# Patient Record
Sex: Female | Born: 1975 | Race: Asian | Hispanic: No | Marital: Married | Smoking: Never smoker
Health system: Southern US, Community
[De-identification: ages and names within clinical notes are randomized; demographics above are authoritative.]

## PROBLEM LIST (undated history)

## (undated) DIAGNOSIS — R011 Cardiac murmur, unspecified: Secondary | ICD-10-CM

## (undated) DIAGNOSIS — N390 Urinary tract infection, site not specified: Secondary | ICD-10-CM

## (undated) HISTORY — DX: Cardiac murmur, unspecified: R01.1

## (undated) HISTORY — DX: Urinary tract infection, site not specified: N39.0

## (undated) HISTORY — PX: UTERINE FIBROID SURGERY: SHX826

---

## 2017-06-02 DIAGNOSIS — R300 Dysuria: Secondary | ICD-10-CM | POA: Diagnosis not present

## 2017-06-02 DIAGNOSIS — N3001 Acute cystitis with hematuria: Secondary | ICD-10-CM | POA: Diagnosis not present

## 2017-07-02 ENCOUNTER — Ambulatory Visit (INDEPENDENT_AMBULATORY_CARE_PROVIDER_SITE_OTHER): Payer: BLUE CROSS/BLUE SHIELD | Admitting: Family

## 2017-07-02 ENCOUNTER — Encounter: Payer: Self-pay | Admitting: Family

## 2017-07-02 VITALS — BP 126/80 | HR 86 | Temp 98.4°F | Wt 136.8 lb

## 2017-07-02 DIAGNOSIS — Z87898 Personal history of other specified conditions: Secondary | ICD-10-CM | POA: Diagnosis not present

## 2017-07-02 DIAGNOSIS — Z0000 Encounter for general adult medical examination without abnormal findings: Secondary | ICD-10-CM | POA: Insufficient documentation

## 2017-07-02 DIAGNOSIS — Z7689 Persons encountering health services in other specified circumstances: Secondary | ICD-10-CM

## 2017-07-02 DIAGNOSIS — Z319 Encounter for procreative management, unspecified: Secondary | ICD-10-CM | POA: Diagnosis not present

## 2017-07-02 NOTE — Assessment & Plan Note (Signed)
Patient and husband would to call/research clinics on their own. Will let me know if needs a referral.

## 2017-07-02 NOTE — Assessment & Plan Note (Signed)
Resolved. 12 years ago. Declines cardiology referral

## 2017-07-02 NOTE — Patient Instructions (Addendum)
Needs tetanus vaccine.   May get Tdap at local pharmacy  Bring  Labs from [facility] at some point.   Pap testing every 3 years AS LONG AS NORMAL.  Let me know if you need referral for fertility clinic- happy to place referral if needed  Pleasure meeting you

## 2017-07-02 NOTE — Progress Notes (Signed)
Pre visit review using our clinic review tool, if applicable. No additional management support is needed unless otherwise documented below in the visit note. 

## 2017-07-02 NOTE — Assessment & Plan Note (Signed)
Will bring [facility] from [facility] and return for CPE later this year.

## 2017-07-02 NOTE — Progress Notes (Signed)
Subjective:    Patient ID: Jordan Schmidt, female    DOB: [birth year], 41 y.o.   MRN: [id number]  CC: [name] is a 41 y.o. female who presents today to establish care.    HPI: Feeling well today. Accompanied by husband.   Infertility- had been pursuing IVT for 2+ years in [place], would like to start again.   H/o Palpitations 12 years ago- resolved. Saw heart doctor in [place]. Per patient, testing was normal. noticed with stress had irregular heart beat. No chest pain, sob. No h/o MI.   Of note, language barrier and husband does most of speaking.   Pap smear normal per patient in [place].      HISTORY:  Past Medical History:  Diagnosis Date  . Heart murmur   . UTI (urinary tract infection)    Past Surgical History:  Procedure Laterality Date  . UTERINE FIBROID SURGERY     Family History  Problem Relation Age of Onset  . Stroke Maternal Grandmother   . Heart disease Maternal Grandfather     Allergies: Patient has no allergy information on record. No current outpatient prescriptions on file prior to visit.   No current facility-administered medications on file prior to visit.     Social History  Substance Use Topics  . Smoking status: Never Smoker  . Smokeless tobacco: Never Used  . Alcohol use No    Review of Systems  Constitutional: Negative for chills and fever.  Respiratory: Negative for cough.   Cardiovascular: Negative for chest pain and palpitations.  Gastrointestinal: Negative for nausea and vomiting.      Objective:    BP 126/80   Pulse 86   Temp 98.4 F (36.9 C) (Oral)   Wt 136 lb 12.8 oz (62.1 kg)   LMP 06/19/2017 (Exact Date)   SpO2 97%  BP Readings from Last 3 Encounters:  07/02/17 126/80   Wt Readings from Last 3 Encounters:  07/02/17 136 lb 12.8 oz (62.1 kg)    Physical Exam  Constitutional: She appears well-developed and well-nourished.  Eyes: Conjunctivae are normal.  Cardiovascular: Normal rate, regular rhythm, normal heart  sounds and normal pulses.   Pulmonary/Chest: Effort normal and breath sounds normal. She has no wheezes. She has no rhonchi. She has no rales.  Neurological: She is alert.  Skin: Skin is warm and dry.  Psychiatric: She has a normal mood and affect. Her speech is normal and behavior is normal. Thought content normal.  Vitals reviewed.      Assessment & Plan:   Problem List Items Addressed This Visit      Other   Infertility management - Primary    Patient and husband would to call/research clinics on their own. Will let me know if needs a referral.       Encounter to establish care    Will [facility] and return for CPE later this year.      History of palpitations    Resolved. 12 years ago. Declines cardiology referral          Ms. Badders does not currently have medications on file.   No orders of the defined types were placed in this encounter.   Return precautions given.   Risks, benefits, and alternatives of the medications and treatment plan prescribed today were discussed, and patient expressed understanding.   Education regarding symptom management and diagnosis given to patient on AVS.  Continue to follow with No primary care provider on file. for  routine health maintenance.   Cambry Scheffel and I agreed with plan.   Rennie PlowmanMargaret Soham Hollett, FNP

## 2017-07-21 ENCOUNTER — Telehealth: Payer: Self-pay | Admitting: Family

## 2017-07-21 NOTE — Telephone Encounter (Signed)
Pt dropped off lab results that Arnett requested. Papers are up front in color folder.

## 2017-07-21 NOTE — Telephone Encounter (Signed)
The lab results have been placed in provider folder.

## 2017-07-22 ENCOUNTER — Telehealth: Payer: Self-pay | Admitting: Family

## 2017-07-22 NOTE — Telephone Encounter (Signed)
Call pt  rec'd labs  Kidney and renal function are normal. Thyroid normal. No anemia. Normal liver enzymes. Cholesterol is low. You're low cardiovascular risk.  Vitamin D is slightly low. Please start cholecalciferol 800 units daily for next 3 to 4 months. You may find this over the counter in the drug store. Please call to make an appointment for 3 to 4 months out so we can recheck. Also, please ensure you are following a diet high in calcium -- research shows better outcomes with dietary sources including kale, yogurt, broccolii, cheese, okra, almonds- to name a few.    Follow up as needed

## 2017-07-22 NOTE — Telephone Encounter (Signed)
Patient stated that she will have husband call for results due to her not speaking english that well.

## 2017-07-22 NOTE — Telephone Encounter (Signed)
Patients husband was informed of results.  Patients husband understood and no questions, comments, or concerns at this time.  

## 2017-11-18 ENCOUNTER — Encounter: Payer: BLUE CROSS/BLUE SHIELD | Admitting: Family

## 2018-04-19 ENCOUNTER — Other Ambulatory Visit (HOSPITAL_COMMUNITY)
Admission: RE | Admit: 2018-04-19 | Discharge: 2018-04-19 | Disposition: A | Discharge status: Home / Self Care | Payer: BLUE CROSS/BLUE SHIELD | Source: Ambulatory Visit | Attending: Family | Admitting: Family

## 2018-04-19 ENCOUNTER — Encounter: Payer: Self-pay | Admitting: Family

## 2018-04-19 ENCOUNTER — Ambulatory Visit (INDEPENDENT_AMBULATORY_CARE_PROVIDER_SITE_OTHER): Payer: BLUE CROSS/BLUE SHIELD | Admitting: Family

## 2018-04-19 VITALS — BP 134/90 | HR 105 | Temp 98.5°F | Resp 16 | Ht 64.0 in | Wt 141.5 lb

## 2018-04-19 DIAGNOSIS — Z0000 Encounter for general adult medical examination without abnormal findings: Secondary | ICD-10-CM

## 2018-04-19 DIAGNOSIS — Z1151 Encounter for screening for human papillomavirus (HPV): Secondary | ICD-10-CM | POA: Diagnosis not present

## 2018-04-19 NOTE — Progress Notes (Signed)
Subjective:    Patient ID: Jordan Schmidt, female    DOB: 01-22-1976, 42 y.o.   MRN: [id number]  CC: Jordan Schmidt is a 42 y.o. female who presents today for physical exam.    HPI: No complaints today Feeling well.  Thinks they may try a fertility clinic later this year, however unsure.    Colorectal Cancer Screening: No early family history.  Breast Cancer Screening: Mammogram due  Cervical Cancer Screening: due. No pelvic pain.  Bone Health screening/DEXA for 65+: No increased fracture risk. Defer screening at this time. Lung Cancer Screening: Doesn't have 30 year pack year history and age > 55 years.       Tetanus - unsure  Labs: Screening labs done with husbands employer.  Exercise: Gets regular exercise.  Alcohol use: none Smoking/tobacco use: Nonsmoker.  Regular dental exams: UTD Wears seat belt: Yes. Skin: no h/o skin cancer or new lesions on skin  HISTORY:  Past Medical History:  Diagnosis Date  . Heart murmur   . UTI (urinary tract infection)     Past Surgical History:  Procedure Laterality Date  . UTERINE FIBROID SURGERY     Family History  Problem Relation Age of Onset  . Stroke Maternal Grandmother   . Heart disease Maternal Grandfather   . Colon cancer Neg Hx   . Breast cancer Neg Hx       ALLERGIES: Patient has no known allergies.  Current Outpatient Medications on File Prior to Visit  Medication Sig Dispense Refill  . Multiple Vitamin (MULTIVITAMIN WITH MINERALS) TABS tablet Take 1 tablet by mouth daily.     No current facility-administered medications on file prior to visit.     Social History   Tobacco Use  . Smoking status: Never Smoker  . Smokeless tobacco: Never Used  Substance Use Topics  . Alcohol use: No  . Drug use: No    Review of Systems  Constitutional: Negative for chills, fever and unexpected weight change.  HENT: Negative for congestion.   Respiratory: Negative for cough.   Cardiovascular: Negative for chest pain,  palpitations and leg swelling.  Gastrointestinal: Negative for nausea and vomiting.  Genitourinary: Negative for pelvic pain.  Musculoskeletal: Negative for arthralgias and myalgias.  Skin: Negative for rash.  Neurological: Negative for headaches.  Hematological: Negative for adenopathy.  Psychiatric/Behavioral: Negative for confusion.      Objective:    BP 134/90 (BP Location: Left Arm, Patient Position: Sitting, Cuff Size: Normal)   Pulse (!) 105   Temp 98.5 F (36.9 C) (Oral)   Resp 16   Ht  (1.626 m)   Wt 141 lb 8 oz (64.2 kg)   LMP 04/01/2018   SpO2 97%   BMI 24.29 kg/m   BP Readings from Last 3 Encounters:  04/19/18 134/90  07/02/17 126/80   Wt Readings from Last 3 Encounters:  04/19/18 141 lb 8 oz (64.2 kg)  07/02/17 136 lb 12.8 oz (62.1 kg)    Physical Exam  Constitutional: She appears well-developed and well-nourished.  Eyes: Conjunctivae are normal.  Neck: No thyroid mass and no thyromegaly present.  Cardiovascular: Normal rate, regular rhythm, normal heart sounds and normal pulses.  Pulmonary/Chest: Effort normal and breath sounds normal. She has no wheezes. She has no rhonchi. She has no rales. Right breast exhibits no inverted nipple, no mass, no nipple discharge, no skin change and no tenderness. Left breast exhibits no inverted nipple, no mass, no nipple discharge, no skin change and no tenderness.  Breasts are symmetrical.  No masses or asymmetry appreciated during CBE.  Genitourinary: Uterus is not enlarged, not fixed and not tender. Cervix exhibits no motion tenderness, no discharge and no friability. Right adnexum displays no mass, no tenderness and no fullness. Left adnexum displays no mass, no tenderness and no fullness.  Genitourinary Comments: Pap performed. No CMT. Unable to appreciated ovaries.  Lymphadenopathy:       Head (right side): No submental, no submandibular, no tonsillar, no preauricular, no posterior auricular and no occipital  adenopathy present.       Head (left side): No submental, no submandibular, no tonsillar, no preauricular, no posterior auricular and no occipital adenopathy present.       Right cervical: No superficial cervical, no deep cervical and no posterior cervical adenopathy present.      Left cervical: No superficial cervical, no deep cervical and no posterior cervical adenopathy present.    She has no axillary adenopathy.       Right axillary: No pectoral and no lateral adenopathy present.       Left axillary: No pectoral and no lateral adenopathy present. Neurological: She is alert.  Skin: Skin is warm and dry.  Psychiatric: She has a normal mood and affect. Her speech is normal and behavior is normal. Thought content normal.  Vitals reviewed.      Assessment & Plan:   Problem List Items Addressed This Visit      Other   Routine physical examination - Primary    CBE and pap performed today. Patient will let me know if tetanus is due based on records at home. Mammogram ( baseline) ordered and scheduled for patient.       Relevant Orders   Cytology - PAP   MM 3D SCREEN BREAST BILATERAL       I am having Jordan Schmidt maintain her multivitamin with minerals.   No orders of the defined types were placed in this encounter.   Return precautions given.   Risks, benefits, and alternatives of the medications and treatment plan prescribed today were discussed, and patient expressed understanding.   Education regarding symptom management and diagnosis given to patient on AVS.   Continue to follow with Jordan Grana, FNP for routine health maintenance.   Jordan Schmidt and I agreed with plan.   Jordan Plowman, FNP

## 2018-04-19 NOTE — Patient Instructions (Addendum)
Let us know when your tetanus vaccine was.  Labs at [facility] as you did last year. You may send them to our office - FAX [place], [place] Adopting a healthy lifestyle and getting preventive care can go a long way to promote health and wellness. Talk with your health care provider about what schedule of regular examinations is right for you. This is a good chance for you to check in with your provider about disease prevention and staying healthy. In between checkups, there are plenty of things you can do on your own. Experts have done a lot of research about which lifestyle changes and preventive measures are most likely to keep you healthy. Ask your health care provider for more information. Weight and diet Eat a healthy diet  Be sure to include plenty of vegetables, fruits, low-fat dairy products, and lean protein.  Do not eat a lot of foods high in solid fats, added sugars, or salt.  Get regular exercise. This is one of the most important things you can do for your health. ? Most adults should exercise for at least 150 minutes each week. The exercise should increase your heart rate and make you sweat (moderate-intensity exercise). ? Most adults should also do strengthening exercises at least twice a week. This is in addition to the moderate-intensity exercise.  Maintain a healthy weight  Body mass index (BMI) is a measurement that can be used to identify possible weight problems. It estimates body fat based on height and weight. Your health care provider can help determine your BMI and help you achieve or maintain a healthy weight.  For females 80 years of age and older: ? A BMI below 18.5 is considered underweight. ? A BMI of 18.5 to 24.9 is normal. ? A BMI of 25 to 29.9 is considered overweight. ? A BMI of 30 and above is considered obese.  Watch levels of cholesterol and blood lipids  You should start having your blood tested for lipids and cholesterol at 40  years of age, then have this test every 5 years.  You may need to have your cholesterol levels checked more often if: ? Your lipid or cholesterol levels are high. ? You are older than 40 years of age. ? You are at high risk for heart disease.  Cancer screening Lung Cancer  Lung cancer screening is recommended for adults 44-42 years old who are at high risk for lung cancer because of a history of smoking.  A yearly low-dose CT scan of the lungs is recommended for people who: ? Currently smoke. ? Have quit within the past 15 years. ? Have at least a 30-pack-year history of smoking. A pack year is smoking an average of one pack of cigarettes a day for 1 year.  Yearly screening should continue until it has been 15 years since you quit.  Yearly screening should stop if you develop a health problem that would prevent you from having lung cancer treatment.  Breast Cancer  Practice breast self-awareness. This means understanding how your breasts normally appear and feel.  It also means doing regular breast self-exams. Let your health care provider know about any changes, no matter how small.  If you are in your 20s or 30s, you should have a clinical breast exam (CBE) by a health care provider every 1-3 years as part of a regular health exam.  If you are 80 or older, have a CBE every year. Also consider having a breast  X-ray (mammogram) every year.  If you have a family history of breast cancer, talk to your health care provider about genetic screening.  If you are at high risk for breast cancer, talk to your health care provider about having an MRI and a mammogram every year.  Breast cancer gene (BRCA) assessment is recommended for women who have family members with BRCA-related cancers. BRCA-related cancers include: ? Breast. ? Ovarian. ? Tubal. ? Peritoneal cancers.  Results of the assessment will determine the need for genetic counseling and BRCA1 and BRCA2 testing.  Cervical  Cancer Your health care provider may recommend that you be screened regularly for cancer of the pelvic organs (ovaries, uterus, and vagina). This screening involves a pelvic examination, including checking for microscopic changes to the surface of your cervix (Pap test). You may be encouraged to have this screening done every 3 years, beginning at age 78.  For women ages 57-65, health care providers may recommend pelvic exams and Pap testing every 3 years, or they may recommend the Pap and pelvic exam, combined with testing for human papilloma virus (HPV), every 5 years. Some types of HPV increase your risk of cervical cancer. Testing for HPV may also be done on women of any age with unclear Pap test results.  Other health care providers may not recommend any screening for nonpregnant women who are considered low risk for pelvic cancer and who do not have symptoms. Ask your health care provider if a screening pelvic exam is right for you.  If you have had past treatment for cervical cancer or a condition that could lead to cancer, you need Pap tests and screening for cancer for at least 20 years after your treatment. If Pap tests have been discontinued, your risk factors (such as having a new sexual partner) need to be reassessed to determine if screening should resume. Some women have medical problems that increase the chance of getting cervical cancer. In these cases, your health care provider may recommend more frequent screening and Pap tests.  Colorectal Cancer  This type of cancer can be detected and often prevented.  Routine colorectal cancer screening usually begins at 40 years of age and continues through 40 years of age.  Your health care provider may recommend screening at an earlier age if you have risk factors for colon cancer.  Your health care provider may also recommend using home test kits to check for hidden blood in the stool.  A small camera at the end of a tube can be used to  examine your colon directly (sigmoidoscopy or colonoscopy). This is done to check for the earliest forms of colorectal cancer.  Routine screening usually begins at age 36.  Direct examination of the colon should be repeated every 5-10 years through 40 years of age. However, you may need to be screened more often if early forms of precancerous polyps or small growths are found.  Skin Cancer  Check your skin from head to toe regularly.  Tell your health care provider about any new moles or changes in moles, especially if there is a change in a mole's shape or color.  Also tell your health care provider if you have a mole that is larger than the size of a pencil eraser.  Always use sunscreen. Apply sunscreen liberally and repeatedly throughout the day.  Protect yourself by wearing long sleeves, pants, a wide-brimmed hat, and sunglasses whenever you are outside.  Heart disease, diabetes, and high blood pressure  High blood  pressure causes heart disease and increases the risk of stroke. High blood pressure is more likely to develop in: ? People who have blood pressure in the high end of the normal range (130-139/85-89 mm Hg). ? People who are overweight or obese. ? People who are African American.  If you are 73-13 years of age, have your blood pressure checked every 3-5 years. If you are 25 years of age or older, have your blood pressure checked every year. You should have your blood pressure measured twice-once when you are at a hospital or clinic, and once when you are not at a hospital or clinic. Record the average of the two measurements. To check your blood pressure when you are not at a hospital or clinic, you can use: ? An automated blood pressure machine at a pharmacy. ? A home blood pressure monitor.  If you are between 32 years and 64 years old, ask your health care provider if you should take aspirin to prevent strokes.  Have regular diabetes screenings. This involves taking a  blood sample to check your fasting blood sugar level. ? If you are at a normal weight and have a low risk for diabetes, have this test once every three years after 40 years of age. ? If you are overweight and have a high risk for diabetes, consider being tested at a younger age or more often. Preventing infection Hepatitis B  If you have a higher risk for hepatitis B, you should be screened for this virus. You are considered at high risk for hepatitis B if: ? You were born in a country where hepatitis B is common. Ask your health care provider which countries are considered high risk. ? Your parents were born in a high-risk country, and you have not been immunized against hepatitis B (hepatitis B vaccine). ? You have HIV or AIDS. ? You use needles to inject street drugs. ? You live with someone who has hepatitis B. ? You have had sex with someone who has hepatitis B. ? You get hemodialysis treatment. ? You take certain medicines for conditions, including cancer, organ transplantation, and autoimmune conditions.  Hepatitis C  Blood testing is recommended for: ? Everyone born from [birth year] through 1963. ? Anyone with known risk factors for hepatitis C.  Sexually transmitted infections (STIs)  You should be screened for sexually transmitted infections (STIs) including gonorrhea and chlamydia if: ? You are sexually active and are younger than 40 years of age. ? You are older than 40 years of age and your health care provider tells you that you are at risk for this type of infection. ? Your sexual activity has changed since you were last screened and you are at an increased risk for chlamydia or gonorrhea. Ask your health care provider if you are at risk.  If you do not have HIV, but are at risk, it may be recommended that you take a prescription medicine daily to prevent HIV infection. This is called pre-exposure prophylaxis (PrEP). You are considered at risk if: ? You are sexually active and  do not regularly use condoms or know the HIV status of your partner(s). ? You take drugs by injection. ? You are sexually active with a partner who has HIV.  Talk with your health care provider about whether you are at high risk of being infected with HIV. If you choose to begin PrEP, you should first be tested for HIV. You should then be tested every 3 months for as  long as you are taking PrEP. Pregnancy  If you are premenopausal and you may become pregnant, ask your health care provider about preconception counseling.  If you may become pregnant, take 400 to 800 micrograms (mcg) of folic acid every day.  If you want to prevent pregnancy, talk to your health care provider about birth control (contraception). Osteoporosis and menopause  Osteoporosis is a disease in which the bones lose minerals and strength with aging. This can result in serious bone fractures. Your risk for osteoporosis can be identified using a bone density scan.  If you are 59 years of age or older, or if you are at risk for osteoporosis and fractures, ask your health care provider if you should be screened.  Ask your health care provider whether you should take a calcium or vitamin D supplement to lower your risk for osteoporosis.  Menopause may have certain physical symptoms and risks.  Hormone replacement therapy may reduce some of these symptoms and risks. Talk to your health care provider about whether hormone replacement therapy is right for you. Follow these instructions at home:  Schedule regular health, dental, and eye exams.  Stay current with your immunizations.  Do not use any tobacco products including cigarettes, chewing tobacco, or electronic cigarettes.  If you are pregnant, do not drink alcohol.  If you are breastfeeding, limit how much and how often you drink alcohol.  Limit alcohol intake to no more than 1 drink per day for nonpregnant women. One drink equals 12 ounces of beer, 5 ounces of  wine, or 1 ounces of hard liquor.  Do not use street drugs.  Do not share needles.  Ask your health care provider for help if you need support or information about quitting drugs.  Tell your health care provider if you often feel depressed.  Tell your health care provider if you have ever been abused or do not feel safe at home. This information is not intended to replace advice given to you by your health care provider. Make sure you discuss any questions you have with your health care provider. Document Released: 06/16/2011 Document Revised: 05/08/2016 Document Reviewed: 09/04/2015 Elsevier Interactive Patient Education  [employer].

## 2018-04-19 NOTE — Assessment & Plan Note (Signed)
CBE and pap performed today. Patient will let me know if tetanus is due based on records at home. Mammogram ( baseline) ordered and scheduled for patient.

## 2018-04-20 LAB — CYTOLOGY - PAP
DIAGNOSIS: NEGATIVE
HPV (WINDOPATH): NOT DETECTED

## 2018-04-20 NOTE — Progress Notes (Signed)
Left message to call.

## 2018-04-28 NOTE — Progress Notes (Signed)
Left message to call.

## 2018-05-03 ENCOUNTER — Telehealth: Payer: Self-pay

## 2018-05-03 NOTE — Telephone Encounter (Signed)
Copied from CRM [id number]. Topic: Quick Communication - Office Called Patient >> Apr 28, 2018  4:14 PM Alisia Ferrari, CMA wrote: Reason for CRM: please transfer to office thanks  >> May 03, 2018  4:57 PM Mickel Baas B, NT wrote: Patient's husband returning call. Please advise. CB#: [phone]

## 2018-05-13 NOTE — Progress Notes (Signed)
See telephone message

## 2018-05-13 NOTE — Telephone Encounter (Signed)
Tried reaching but unable to with number given in message. Spoke with husband and he will check wife's heart rate for a few days and call us back with numbers.   Also he is questioning on why mammogram was ordered due to patient is 89 1/2.  Please advise. Thanks

## 2018-05-13 NOTE — Telephone Encounter (Signed)
Call husband ( please ensure we can speak with him)   Wife's HR was 105 in the office , I suspected from nerves . This is mildly elevated. I wanted her to monitor at home and to ensure she didn't appreciate palpitations. She was having labs with [facility] and I would be interested in those as anemia, or thyroid conditions can elevated.   We had trouble understanding each other and I wanted to ensure that she monitored her HR  As for mammogram, some women prefer a baseline mammogram in their late 30's which I had offered her. Typically covered by insurance companies for baseline however I would advise him to call their insurance to be sure. She may either get it done now OR she may wait until December when she is 14.

## 2018-05-17 ENCOUNTER — Ambulatory Visit
Admission: RE | Admit: 2018-05-17 | Discharge: 2018-05-17 | Disposition: A | Discharge status: Home / Self Care | Payer: BLUE CROSS/BLUE SHIELD | Source: Ambulatory Visit | Attending: Family | Admitting: Family

## 2018-05-17 ENCOUNTER — Encounter: Payer: Self-pay | Admitting: Family

## 2018-05-17 DIAGNOSIS — Z1231 Encounter for screening mammogram for malignant neoplasm of breast: Secondary | ICD-10-CM | POA: Diagnosis not present

## 2018-05-17 DIAGNOSIS — Z0000 Encounter for general adult medical examination without abnormal findings: Secondary | ICD-10-CM | POA: Insufficient documentation

## 2018-05-17 NOTE — Telephone Encounter (Signed)
Spoke with patients husband and he states patients heart rate has been running in the 80s when checked at home. Also will send mychart message on specific heartrates numbers.   She had her mammogram today.  He states she will get labs done.

## 2018-05-18 ENCOUNTER — Telehealth: Payer: Self-pay

## 2018-05-18 DIAGNOSIS — Z0000 Encounter for general adult medical examination without abnormal findings: Secondary | ICD-10-CM

## 2018-05-18 NOTE — Telephone Encounter (Signed)
Copied from CRM [id number]. Topic: General - Other >> May 18, 2018  9:31 AM Percival SpanishKennedy, Cheryl W wrote:  Please check with [employer] cause pt said she was told to go to [facility] and have labs drawn and she went and they did not have a order

## 2018-05-21 NOTE — Telephone Encounter (Signed)
Please print labs that I just ordered I will sign and pateint can take to [facility]

## 2018-05-21 NOTE — Addendum Note (Signed)
Addended by: Allegra GranaARNETT, MARGARET G on: 05/21/2018 12:46 PM   Modules accepted: Orders

## 2018-05-24 NOTE — Telephone Encounter (Signed)
Orders placed in your folder for signature. 

## 2018-05-26 NOTE — Telephone Encounter (Signed)
Left voicemail for Jordan Schmidt on DPR advising lab orders placed up front for pick up

## 2018-05-31 DIAGNOSIS — Z0000 Encounter for general adult medical examination without abnormal findings: Secondary | ICD-10-CM | POA: Diagnosis not present

## 2018-06-01 LAB — CBC WITH DIFFERENTIAL/PLATELET
Basophils Absolute: 0 10*3/uL (ref 0.0–0.2)
Basos: 1 %
EOS (ABSOLUTE): 0.2 10*3/uL (ref 0.0–0.4)
Eos: 3 %
HEMATOCRIT: 43.3 % (ref 34.0–46.6)
HEMOGLOBIN: 14.7 g/dL (ref 11.1–15.9)
IMMATURE GRANULOCYTES: 0 %
Immature Grans (Abs): 0 10*3/uL (ref 0.0–0.1)
LYMPHS: 42 %
Lymphocytes Absolute: 2.6 10*3/uL (ref 0.7–3.1)
MCH: 31.3 pg (ref 26.6–33.0)
MCHC: 33.9 g/dL (ref 31.5–35.7)
MCV: 92 fL (ref 79–97)
MONOCYTES: 4 %
Monocytes Absolute: 0.3 10*3/uL (ref 0.1–0.9)
NEUTROS PCT: 50 %
Neutrophils Absolute: 3.1 10*3/uL (ref 1.4–7.0)
Platelets: 311 10*3/uL (ref 150–450)
RBC: 4.7 x10E6/uL (ref 3.77–5.28)
RDW: 12.4 % (ref 12.3–15.4)
WBC: 6.2 10*3/uL (ref 3.4–10.8)

## 2018-06-01 LAB — COMPREHENSIVE METABOLIC PANEL
ALBUMIN: 4.7 g/dL (ref 3.5–5.5)
ALT: 15 IU/L (ref 0–32)
AST: 19 IU/L (ref 0–40)
Albumin/Globulin Ratio: 1.8 (ref 1.2–2.2)
Alkaline Phosphatase: 76 IU/L (ref 39–117)
BUN / CREAT RATIO: 17 (ref 9–23)
BUN: 13 mg/dL (ref 6–20)
Bilirubin Total: 0.5 mg/dL (ref 0.0–1.2)
CALCIUM: 9.4 mg/dL (ref 8.7–10.2)
CO2: 23 mmol/L (ref 20–29)
CREATININE: 0.77 mg/dL (ref 0.57–1.00)
Chloride: 100 mmol/L (ref 96–106)
GFR calc Af Amer: 112 mL/min/{1.73_m2} (ref >59)
GFR, EST NON AFRICAN AMERICAN: 98 mL/min/{1.73_m2} (ref >59)
GLOBULIN, TOTAL: 2.6 g/dL (ref 1.5–4.5)
GLUCOSE: 92 mg/dL (ref 65–99)
Potassium: 4.3 mmol/L (ref 3.5–5.2)
SODIUM: 141 mmol/L (ref 134–144)
Total Protein: 7.3 g/dL (ref 6.0–8.5)

## 2018-06-01 LAB — LIPID PANEL
Chol/HDL Ratio: 3.3 ratio (ref 0.0–4.4)
Cholesterol, Total: 183 mg/dL (ref 100–199)
HDL: 55 mg/dL (ref >39)
LDL Calculated: 102 mg/dL — ABNORMAL HIGH (ref 0–99)
TRIGLYCERIDES: 128 mg/dL (ref 0–149)
VLDL CHOLESTEROL CAL: 26 mg/dL (ref 5–40)

## 2018-06-01 LAB — VITAMIN D 25 HYDROXY (VIT D DEFICIENCY, FRACTURES): VIT D 25 HYDROXY: 42.8 ng/mL (ref 30.0–100.0)

## 2018-06-01 LAB — HEMOGLOBIN A1C
Est. average glucose Bld gHb Est-mCnc: 108 mg/dL
Hgb A1c MFr Bld: 5.4 % (ref 4.8–5.6)

## 2018-06-01 LAB — TSH: TSH: 2.05 u[IU]/mL (ref 0.450–4.500)

## 2019-07-01 ENCOUNTER — Other Ambulatory Visit: Payer: Self-pay | Admitting: Family

## 2019-07-01 DIAGNOSIS — Z1231 Encounter for screening mammogram for malignant neoplasm of breast: Secondary | ICD-10-CM

## 2019-07-04 ENCOUNTER — Ambulatory Visit
Admission: RE | Admit: 2019-07-04 | Discharge: 2019-07-04 | Disposition: A | Discharge status: Home / Self Care | Payer: BC Managed Care – PPO | Source: Ambulatory Visit | Attending: Family | Admitting: Family

## 2019-07-04 DIAGNOSIS — Z1231 Encounter for screening mammogram for malignant neoplasm of breast: Secondary | ICD-10-CM | POA: Diagnosis not present

## 2020-03-15 ENCOUNTER — Ambulatory Visit: Payer: Self-pay | Attending: Internal Medicine

## 2020-03-15 DIAGNOSIS — Z23 Encounter for immunization: Secondary | ICD-10-CM

## 2020-03-15 NOTE — Progress Notes (Signed)
   Covid-19 Vaccination Clinic  Name:  Jayleah Garbers    MRN: [id number] DOB: 1976-02-16  03/15/2020  Ms. Geise was observed post Covid-19 immunization for 15 minutes without incident. She was provided with Vaccine Information Sheet and instruction to access the V-Safe system.   Ms. Watling was instructed to call 911 with any severe reactions post vaccine: Marland Kitchen Difficulty breathing  . Swelling of face and throat  . A fast heartbeat  . A bad rash all over body  . Dizziness and weakness   Immunizations Administered    Name Date Dose VIS Date Route   Pfizer COVID-19 Vaccine 03/15/2020 10:37 AM 0.3 mL 11/25/2019 Intramuscular   Manufacturer: [employer], [employer]   Lot: [id number]   NDC: [address]-3

## 2020-04-09 ENCOUNTER — Ambulatory Visit: Payer: Self-pay | Attending: Internal Medicine

## 2020-04-09 DIAGNOSIS — Z23 Encounter for immunization: Secondary | ICD-10-CM

## 2020-04-09 NOTE — Progress Notes (Signed)
   Covid-19 Vaccination Clinic  Name:  Benna Arno    MRN: [id number] DOB: Dec 29, 1975  04/09/2020  Ms. Zuno was observed post Covid-19 immunization for 15 minutes without incident. She was provided with Vaccine Information Sheet and instruction to access the V-Safe system.   Ms. Tafolla was instructed to call 911 with any severe reactions post vaccine: Marland Kitchen Difficulty breathing  . Swelling of face and throat  . A fast heartbeat  . A bad rash all over body  . Dizziness and weakness   Immunizations Administered    Name Date Dose VIS Date Route   Pfizer COVID-19 Vaccine 04/09/2020  9:01 AM 0.3 mL 02/08/2019 Intramuscular   Manufacturer: [employer], [employer]   Lot: [id number]   NDC: [address]-5

## 2020-06-22 ENCOUNTER — Other Ambulatory Visit: Payer: Self-pay | Admitting: Family

## 2020-06-22 DIAGNOSIS — Z1231 Encounter for screening mammogram for malignant neoplasm of breast: Secondary | ICD-10-CM

## 2020-06-25 ENCOUNTER — Ambulatory Visit (INDEPENDENT_AMBULATORY_CARE_PROVIDER_SITE_OTHER): Payer: BC Managed Care – PPO | Admitting: Family

## 2020-06-25 ENCOUNTER — Other Ambulatory Visit: Payer: Self-pay

## 2020-06-25 ENCOUNTER — Encounter: Payer: Self-pay | Admitting: Family

## 2020-06-25 VITALS — BP 100/72 | HR 105 | Temp 98.1°F | Ht 64.02 in | Wt 141.8 lb

## 2020-06-25 DIAGNOSIS — Z0000 Encounter for general adult medical examination without abnormal findings: Secondary | ICD-10-CM | POA: Diagnosis not present

## 2020-06-25 LAB — CBC WITH DIFFERENTIAL/PLATELET
Basophils Absolute: 0.1 10*3/uL (ref 0.0–0.1)
Basophils Relative: 0.7 % (ref 0.0–3.0)
Eosinophils Absolute: 0.1 10*3/uL (ref 0.0–0.7)
Eosinophils Relative: 1 % (ref 0.0–5.0)
HCT: 41.4 % (ref 36.0–46.0)
Hemoglobin: 14 g/dL (ref 12.0–15.0)
Lymphocytes Relative: 21.9 % (ref 12.0–46.0)
Lymphs Abs: 1.9 10*3/uL (ref 0.7–4.0)
MCHC: 33.9 g/dL (ref 30.0–36.0)
MCV: 93.5 fl (ref 78.0–100.0)
Monocytes Absolute: 0.4 10*3/uL (ref 0.1–1.0)
Monocytes Relative: 4.6 % (ref 3.0–12.0)
Neutro Abs: 6.4 10*3/uL (ref 1.4–7.7)
Neutrophils Relative %: 71.8 % (ref 43.0–77.0)
Platelets: 292 10*3/uL (ref 150.0–400.0)
RBC: 4.43 Mil/uL (ref 3.87–5.11)
RDW: 12.8 % (ref 11.5–15.5)
WBC: 8.9 10*3/uL (ref 4.0–10.5)

## 2020-06-25 LAB — COMPREHENSIVE METABOLIC PANEL
ALT: 16 U/L (ref 0–35)
AST: 16 U/L (ref 0–37)
Albumin: 4.6 g/dL (ref 3.5–5.2)
Alkaline Phosphatase: 71 U/L (ref 39–117)
BUN: 14 mg/dL (ref 6–23)
CO2: 27 mEq/L (ref 19–32)
Calcium: 9.3 mg/dL (ref 8.4–10.5)
Chloride: 103 mEq/L (ref 96–112)
Creatinine, Ser: 0.8 mg/dL (ref 0.40–1.20)
GFR: 78.82 mL/min (ref >60.00)
Glucose, Bld: 102 mg/dL — ABNORMAL HIGH (ref 70–99)
Potassium: 4.2 mEq/L (ref 3.5–5.1)
Sodium: 138 mEq/L (ref 135–145)
Total Bilirubin: 0.3 mg/dL (ref 0.2–1.2)
Total Protein: 6.9 g/dL (ref 6.0–8.3)

## 2020-06-25 LAB — LIPID PANEL
Cholesterol: 177 mg/dL (ref 0–200)
HDL: 50.9 mg/dL (ref >39.00)
LDL Cholesterol: 89 mg/dL (ref 0–99)
NonHDL: 126.4
Total CHOL/HDL Ratio: 3
Triglycerides: 187 mg/dL — ABNORMAL HIGH (ref 0.0–149.0)
VLDL: 37.4 mg/dL (ref 0.0–40.0)

## 2020-06-25 LAB — VITAMIN D 25 HYDROXY (VIT D DEFICIENCY, FRACTURES): VITD: 19.83 ng/mL — ABNORMAL LOW (ref 30.00–100.00)

## 2020-06-25 LAB — TSH: TSH: 2.03 u[IU]/mL (ref 0.35–4.50)

## 2020-06-25 LAB — HEMOGLOBIN A1C: Hgb A1c MFr Bld: 5.3 % (ref 4.6–6.5)

## 2020-06-25 NOTE — Patient Instructions (Signed)
Nice to see you! ° ° °Health Maintenance, Female °Adopting a healthy lifestyle and getting preventive care are important in promoting health and wellness. Ask your health care provider about: °· The right schedule for you to have regular tests and exams. °· Things you can do on your own to prevent diseases and keep yourself healthy. °What should I know about diet, weight, and exercise? °Eat a healthy diet ° °· Eat a diet that includes plenty of vegetables, fruits, low-fat dairy products, and lean protein. °· Do not eat a lot of foods that are high in solid fats, added sugars, or sodium. °Maintain a healthy weight °Body mass index (BMI) is used to identify weight problems. It estimates body fat based on height and weight. Your health care provider can help determine your BMI and help you achieve or maintain a healthy weight. °Get regular exercise °Get regular exercise. This is one of the most important things you can do for your health. Most adults should: °· Exercise for at least 150 minutes each week. The exercise should increase your heart rate and make you sweat (moderate-intensity exercise). °· Do strengthening exercises at least twice a week. This is in addition to the moderate-intensity exercise. °· Spend less time sitting. Even light physical activity can be beneficial. °Watch cholesterol and blood lipids °Have your blood tested for lipids and cholesterol at 42 years of age, then have this test every 5 years. °Have your cholesterol levels checked more often if: °· Your lipid or cholesterol levels are high. °· You are older than 42 years of age. °· You are at high risk for heart disease. °What should I know about cancer screening? °Depending on your health history and family history, you may need to have cancer screening at various ages. This may include screening for: °· Breast cancer. °· Cervical cancer. °· Colorectal cancer. °· Skin cancer. °· Lung cancer. °What should I know about heart disease, diabetes,  and high blood pressure? °Blood pressure and heart disease °· High blood pressure causes heart disease and increases the risk of stroke. This is more likely to develop in people who have high blood pressure readings, are of African descent, or are overweight. °· Have your blood pressure checked: °? Every 3-5 years if you are 18-39 years of age. °? Every year if you are 40 years old or older. °Diabetes °Have regular diabetes screenings. This checks your fasting blood sugar level. Have the screening done: °· Once every three years after age 40 if you are at a normal weight and have a low risk for diabetes. °· More often and at a younger age if you are overweight or have a high risk for diabetes. °What should I know about preventing infection? °Hepatitis B °If you have a higher risk for hepatitis B, you should be screened for this virus. Talk with your health care provider to find out if you are at risk for hepatitis B infection. °Hepatitis C °Testing is recommended for: °· Everyone born from 1945 through 1965. °· Anyone with known risk factors for hepatitis C. °Sexually transmitted infections (STIs) °· Get screened for STIs, including gonorrhea and chlamydia, if: °? You are sexually active and are younger than 42 years of age. °? You are older than 42 years of age and your health care provider tells you that you are at risk for this type of infection. °? Your sexual activity has changed since you were last screened, and you are at increased risk for chlamydia or gonorrhea.   Ask your health care provider if you are at risk. °· Ask your health care provider about whether you are at high risk for HIV. Your health care provider may recommend a prescription medicine to help prevent HIV infection. If you choose to take medicine to prevent HIV, you should first get tested for HIV. You should then be tested every 3 months for as long as you are taking the medicine. °Pregnancy °· If you are about to stop having your period  (premenopausal) and you may become pregnant, seek counseling before you get pregnant. °· Take 400 to 800 micrograms (mcg) of folic acid every day if you become pregnant. °· Ask for birth control (contraception) if you want to prevent pregnancy. °Osteoporosis and menopause °Osteoporosis is a disease in which the bones lose minerals and strength with aging. This can result in bone fractures. If you are 65 years old or older, or if you are at risk for osteoporosis and fractures, ask your health care provider if you should: °· Be screened for bone loss. °· Take a calcium or vitamin D supplement to lower your risk of fractures. °· Be given hormone replacement therapy (HRT) to treat symptoms of menopause. °Follow these instructions at home: °Lifestyle °· Do not use any products that contain nicotine or tobacco, such as cigarettes, e-cigarettes, and chewing tobacco. If you need help quitting, ask your health care provider. °· Do not use street drugs. °· Do not share needles. °· Ask your health care provider for help if you need support or information about quitting drugs. °Alcohol use °· Do not drink alcohol if: °? Your health care provider tells you not to drink. °? You are pregnant, may be pregnant, or are planning to become pregnant. °· If you drink alcohol: °? Limit how much you use to 0-1 drink a day. °? Limit intake if you are breastfeeding. °· Be aware of how much alcohol is in your drink. In the [place] one drink equals one 12 oz bottle of beer (355 mL), one 5 oz glass of wine (148 mL), or one 1½ oz glass of hard liquor (44 mL). °General instructions °· Schedule regular health, dental, and eye exams. °· Stay current with your vaccines. °· Tell your health care provider if: °? You often feel depressed. °? You have ever been abused or do not feel safe at home. °Summary °· Adopting a healthy lifestyle and getting preventive care are important in promoting health and wellness. °· Follow your health care provider's  instructions about healthy diet, exercising, and getting tested or screened for diseases. °· Follow your health care provider's instructions on monitoring your cholesterol and blood pressure. °This information is not intended to replace advice given to you by your health care provider. Make sure you discuss any questions you have with your health care provider. °Document Revised: 11/24/2018 Document Reviewed: 11/24/2018 °Elsevier Patient Education © 2020 Elsevier Inc. ° °

## 2020-06-25 NOTE — Progress Notes (Signed)
Subjective:    Patient ID: Jordan Schmidt, female    DOB: Dec 15, 1976, 44 y.o.   MRN: [id number]  CC: Jordan Schmidt is a 44 y.o. female who presents today for physical exam.    HPI:  Feels well today No complaints or concerns today     Colorectal Cancer Screening: No early family history Breast Cancer Screening: Mammogram scheduled Cervical Cancer Screening: UTD.History of uterine fibroids. No pelvic pain.          Tetanus - utd        Hepatitis C screening - Candidate for, declines HIV Screening- Candidate for , declines Labs: Screening labs today. Exercise: Gets regular exercise, swimming 3 times per week Alcohol use: none Smoking/tobacco use: Nonsmoker.   Wears seat belt: Yes.  HISTORY:  Past Medical History:  Diagnosis Date  . Heart murmur   . UTI (urinary tract infection)     Past Surgical History:  Procedure Laterality Date  . UTERINE FIBROID SURGERY     Family History  Problem Relation Age of Onset  . Stroke Maternal Grandmother   . Heart disease Maternal Grandfather   . Colon cancer Neg Hx   . Breast cancer Neg Hx       ALLERGIES: Patient has no known allergies.  No current outpatient medications on file prior to visit.   No current facility-administered medications on file prior to visit.    Social History   Tobacco Use  . Smoking status: Never Smoker  . Smokeless tobacco: Never Used  Substance Use Topics  . Alcohol use: No  . Drug use: No    Review of Systems  Constitutional: Negative for chills, fever and unexpected weight change.  HENT: Negative for congestion.   Respiratory: Negative for cough.   Cardiovascular: Negative for chest pain, palpitations and leg swelling.  Gastrointestinal: Negative for nausea and vomiting.  Musculoskeletal: Negative for arthralgias and myalgias.  Skin: Negative for rash.  Neurological: Negative for headaches.  Hematological: Negative for adenopathy.  Psychiatric/Behavioral: Negative for confusion.        Objective:    BP 100/72 (BP Location: Left Arm, Patient Position: Sitting)   Pulse (!) 105   Temp 98.1 F (36.7 C)   Ht 5' 4.02" (1.626 m)   Wt 141 lb 12.8 oz (64.3 kg)   SpO2 96%   BMI 24.33 kg/m   BP Readings from Last 3 Encounters:  06/25/20 100/72  04/19/18 134/90  07/02/17 126/80   Wt Readings from Last 3 Encounters:  06/25/20 141 lb 12.8 oz (64.3 kg)  04/19/18 141 lb 8 oz (64.2 kg)  07/02/17 136 lb 12.8 oz (62.1 kg)    Physical Exam Vitals reviewed.  Constitutional:      Appearance: She is well-developed.  Eyes:     Conjunctiva/sclera: Conjunctivae normal.  Neck:     Thyroid: No thyroid mass or thyromegaly.  Cardiovascular:     Rate and Rhythm: Normal rate and regular rhythm.     Pulses: Normal pulses.     Heart sounds: Normal heart sounds.  Pulmonary:     Effort: Pulmonary effort is normal.     Breath sounds: Normal breath sounds. No wheezing, rhonchi or rales.  Chest:     Breasts: Breasts are symmetrical.        Right: No inverted nipple, mass, nipple discharge, skin change or tenderness.        Left: No inverted nipple, mass, nipple discharge, skin change or tenderness.  Lymphadenopathy:     Head:  Right side of head: No submental, submandibular, tonsillar, preauricular, posterior auricular or occipital adenopathy.     Left side of head: No submental, submandibular, tonsillar, preauricular, posterior auricular or occipital adenopathy.     Cervical: No cervical adenopathy.     Right cervical: No superficial, deep or posterior cervical adenopathy.    Left cervical: No superficial, deep or posterior cervical adenopathy.  Skin:    General: Skin is warm and dry.  Neurological:     Mental Status: She is alert.  Psychiatric:        Speech: Speech normal.        Behavior: Behavior normal.        Thought Content: Thought content normal.        Assessment & Plan:   Problem List Items Addressed This Visit      Other   Routine physical  examination - Primary    Clinical breast exam performed today.  Mammogram is scheduled.  Patient politely declines pelvic exam in the absence of complaints and also Pap smear is up-to-date.  Screening labs ordered.      Relevant Orders   TSH   CBC with Differential/Platelet   Comprehensive metabolic panel   Hemoglobin A1c   Lipid panel   VITAMIN D 25 Hydroxy (Vit-D Deficiency, Fractures)       I have discontinued Schyler Bessire's multivitamin with minerals.   No orders of the defined types were placed in this encounter.   Return precautions given.   Risks, benefits, and alternatives of the medications and treatment plan prescribed today were discussed, and patient expressed understanding.   Education regarding symptom management and diagnosis given to patient on AVS.   Continue to follow with Allegra Grana, FNP for routine health maintenance.   Marquette Somero and I agreed with plan.   Rennie Plowman, FNP

## 2020-06-25 NOTE — Assessment & Plan Note (Signed)
Clinical breast exam performed today.  Mammogram is scheduled.  Patient politely declines pelvic exam in the absence of complaints and also Pap smear is up-to-date.  Screening labs ordered.

## 2020-07-04 ENCOUNTER — Ambulatory Visit
Admission: RE | Admit: 2020-07-04 | Discharge: 2020-07-04 | Disposition: A | Discharge status: Home / Self Care | Payer: BC Managed Care – PPO | Source: Ambulatory Visit | Attending: Family | Admitting: Family

## 2020-07-04 DIAGNOSIS — Z1231 Encounter for screening mammogram for malignant neoplasm of breast: Secondary | ICD-10-CM | POA: Insufficient documentation

## 2020-10-10 DIAGNOSIS — N946 Dysmenorrhea, unspecified: Secondary | ICD-10-CM | POA: Diagnosis not present

## 2020-10-10 DIAGNOSIS — Z86018 Personal history of other benign neoplasm: Secondary | ICD-10-CM | POA: Diagnosis not present

## 2020-10-10 DIAGNOSIS — Z01419 Encounter for gynecological examination (general) (routine) without abnormal findings: Secondary | ICD-10-CM | POA: Diagnosis not present

## 2020-11-16 DIAGNOSIS — Z0189 Encounter for other specified special examinations: Secondary | ICD-10-CM | POA: Diagnosis not present

## 2020-11-19 DIAGNOSIS — Z043 Encounter for examination and observation following other accident: Secondary | ICD-10-CM | POA: Diagnosis not present

## 2020-11-19 DIAGNOSIS — Z713 Dietary counseling and surveillance: Secondary | ICD-10-CM | POA: Diagnosis not present

## 2020-11-22 DIAGNOSIS — Z319 Encounter for procreative management, unspecified: Secondary | ICD-10-CM | POA: Diagnosis not present

## 2020-11-22 DIAGNOSIS — Z86018 Personal history of other benign neoplasm: Secondary | ICD-10-CM | POA: Diagnosis not present

## 2020-11-22 DIAGNOSIS — N945 Secondary dysmenorrhea: Secondary | ICD-10-CM | POA: Diagnosis not present

## 2020-11-22 DIAGNOSIS — N946 Dysmenorrhea, unspecified: Secondary | ICD-10-CM | POA: Diagnosis not present

## 2021-06-07 ENCOUNTER — Other Ambulatory Visit: Payer: Self-pay | Admitting: Family

## 2021-06-07 DIAGNOSIS — Z1231 Encounter for screening mammogram for malignant neoplasm of breast: Secondary | ICD-10-CM

## 2021-06-16 DIAGNOSIS — Z20822 Contact with and (suspected) exposure to covid-19: Secondary | ICD-10-CM | POA: Diagnosis not present

## 2021-06-26 ENCOUNTER — Encounter: Payer: Self-pay | Admitting: Family

## 2021-06-26 ENCOUNTER — Other Ambulatory Visit: Payer: Self-pay

## 2021-06-26 ENCOUNTER — Ambulatory Visit (INDEPENDENT_AMBULATORY_CARE_PROVIDER_SITE_OTHER): Payer: BC Managed Care – PPO | Admitting: Family

## 2021-06-26 DIAGNOSIS — Z0000 Encounter for general adult medical examination without abnormal findings: Secondary | ICD-10-CM

## 2021-06-26 DIAGNOSIS — R002 Palpitations: Secondary | ICD-10-CM | POA: Insufficient documentation

## 2021-06-26 LAB — MAGNESIUM: Magnesium: 2 mg/dL (ref 1.5–2.5)

## 2021-06-26 LAB — COMPREHENSIVE METABOLIC PANEL
ALT: 12 U/L (ref 0–35)
AST: 16 U/L (ref 0–37)
Albumin: 4.7 g/dL (ref 3.5–5.2)
Alkaline Phosphatase: 62 U/L (ref 39–117)
BUN: 14 mg/dL (ref 6–23)
CO2: 29 mEq/L (ref 19–32)
Calcium: 9.5 mg/dL (ref 8.4–10.5)
Chloride: 100 mEq/L (ref 96–112)
Creatinine, Ser: 0.8 mg/dL (ref 0.40–1.20)
GFR: 90.78 mL/min (ref >60.00)
Glucose, Bld: 84 mg/dL (ref 70–99)
Potassium: 4.2 mEq/L (ref 3.5–5.1)
Sodium: 138 mEq/L (ref 135–145)
Total Bilirubin: 0.6 mg/dL (ref 0.2–1.2)
Total Protein: 7.1 g/dL (ref 6.0–8.3)

## 2021-06-26 LAB — CBC WITH DIFFERENTIAL/PLATELET
Basophils Absolute: 0.1 10*3/uL (ref 0.0–0.1)
Basophils Relative: 1.1 % (ref 0.0–3.0)
Eosinophils Absolute: 0.1 10*3/uL (ref 0.0–0.7)
Eosinophils Relative: 2.6 % (ref 0.0–5.0)
HCT: 43.2 % (ref 36.0–46.0)
Hemoglobin: 14.7 g/dL (ref 12.0–15.0)
Lymphocytes Relative: 34 % (ref 12.0–46.0)
Lymphs Abs: 1.6 10*3/uL (ref 0.7–4.0)
MCHC: 34.1 g/dL (ref 30.0–36.0)
MCV: 93.7 fl (ref 78.0–100.0)
Monocytes Absolute: 0.2 10*3/uL (ref 0.1–1.0)
Monocytes Relative: 5.1 % (ref 3.0–12.0)
Neutro Abs: 2.8 10*3/uL (ref 1.4–7.7)
Neutrophils Relative %: 57.2 % (ref 43.0–77.0)
Platelets: 276 10*3/uL (ref 150.0–400.0)
RBC: 4.61 Mil/uL (ref 3.87–5.11)
RDW: 12.3 % (ref 11.5–15.5)
WBC: 4.8 10*3/uL (ref 4.0–10.5)

## 2021-06-26 LAB — VITAMIN D 25 HYDROXY (VIT D DEFICIENCY, FRACTURES): VITD: 24.24 ng/mL — ABNORMAL LOW (ref 30.00–100.00)

## 2021-06-26 LAB — HEMOGLOBIN A1C: Hgb A1c MFr Bld: 5.4 % (ref 4.6–6.5)

## 2021-06-26 LAB — TSH: TSH: 2.03 u[IU]/mL (ref 0.35–5.50)

## 2021-06-26 LAB — LIPID PANEL
Cholesterol: 172 mg/dL (ref 0–200)
HDL: 49.1 mg/dL (ref >39.00)
LDL Cholesterol: 93 mg/dL (ref 0–99)
NonHDL: 123
Total CHOL/HDL Ratio: 4
Triglycerides: 150 mg/dL — ABNORMAL HIGH (ref 0.0–149.0)
VLDL: 30 mg/dL (ref 0.0–40.0)

## 2021-06-26 NOTE — Assessment & Plan Note (Signed)
CBE performed. She will return for Pap smear as on menses. Encouraged continued exercise.

## 2021-06-26 NOTE — Assessment & Plan Note (Addendum)
Episode 2 weeks ago during period of stress. Since resolved.  EKG today showed Sinus Rhythm.  Pending thorough lab for electrolyte disturbances, thyroid disorder. Referral to cardiology for further evaluation consideration of holter monitor, evaluation for structural heart disease.

## 2021-06-26 NOTE — Progress Notes (Signed)
Subjective:    Patient ID: Jordan Schmidt, female    DOB: 09/05/1976, 45 y.o.   MRN: [id number]  CC: [name] is a 45 y.o. female who presents today for physical exam.    HPI: She is concerned that she is having irregular heart rhythm'  2 weeks ago, episode would lasted for 1 day, then resolved. Started 4am when she was laying in bed.   Mild dizziness and left chest  pain with symptom. Chest pain lasted one minute, then resolved.   No nausea, sob, syncope, left arm numbness.  Her aunt passed away one month ago and she has been traveling. Describes as stressful.   During times of stress, she noted heart to feel beat was irregular when she palpated her carotid artery.   Symptom has improved since she has stopped traveling and stress has decreased.  In [place] 15 years or more ago, she saw a cardiology and reports stress test, echocardiogram, and told she had a arrhythmia. She was never prescribed any medication.   Staying at home, married.  Colorectal Cancer Screening: UTD Breast Cancer Screening: Mammogram scheduled Cervical Cancer Screening: due; following with Dr Dalbert Garnet for dysmenorrhea, uterine fibroids.Last pap/hpv: 2019. Neg/neg.         Tetanus - utd         Hepatitis C screening - Candidate for, consents HIV Screening- Candidate for, consents Labs: Screening labs today. Exercise: Gets regular exercise yoga 3 days per week, swimming 2 days per week without CP.    Alcohol use: none Smoking/tobacco use: Nonsmoker.     HISTORY:  Past Medical History:  Diagnosis Date   Heart murmur    UTI (urinary tract infection)     Past Surgical History:  Procedure Laterality Date   UTERINE FIBROID SURGERY     Family History  Problem Relation Age of Onset   Stroke Maternal Grandmother    Heart disease Maternal Grandfather    Colon cancer Paternal Uncle 62   Breast cancer Neg Hx       ALLERGIES: Patient has no known allergies.  Current Outpatient Medications on File  Prior to Visit  Medication Sig Dispense Refill   ascorbic acid (VITAMIN C) 1000 MG tablet Take 1 tablet by mouth daily.     No current facility-administered medications on file prior to visit.    Social History   Tobacco Use   Smoking status: Never   Smokeless tobacco: Never  Substance Use Topics   Alcohol use: No   Drug use: No    Review of Systems  Constitutional:  Negative for chills, fever and unexpected weight change.  HENT:  Negative for congestion.   Respiratory:  Negative for cough and shortness of breath.   Cardiovascular:  Negative for chest pain (resolved), palpitations (resolved) and leg swelling.  Gastrointestinal:  Negative for nausea and vomiting.  Musculoskeletal:  Negative for arthralgias and myalgias.  Skin:  Negative for rash.  Neurological:  Negative for headaches.  Hematological:  Negative for adenopathy.  Psychiatric/Behavioral:  Negative for confusion.      Objective:    BP 118/82 (BP Location: Left Arm, Patient Position: Sitting, Cuff Size: Large)   Ht 5\' 5"  (1.651 m)   Wt 138 lb 9.6 oz (62.9 kg)   SpO2 99%   BMI 23.06 kg/m   BP Readings from Last 3 Encounters:  06/26/21 118/82  06/25/20 100/72  04/19/18 134/90   Wt Readings from Last 3 Encounters:  06/26/21 138 lb 9.6 oz (62.9 kg)  06/25/20 141 lb 12.8 oz (64.3 kg)  04/19/18 141 lb 8 oz (64.2 kg)    Physical Exam Vitals reviewed.  Constitutional:      Appearance: Normal appearance. She is well-developed.  Eyes:     Conjunctiva/sclera: Conjunctivae normal.  Neck:     Thyroid: No thyroid mass or thyromegaly.  Cardiovascular:     Rate and Rhythm: Normal rate and regular rhythm.     Pulses: Normal pulses.     Heart sounds: Normal heart sounds.  Pulmonary:     Effort: Pulmonary effort is normal.     Breath sounds: Normal breath sounds. No wheezing, rhonchi or rales.  Chest:  Breasts:    Breasts are symmetrical.     Right: No inverted nipple, mass, nipple discharge, skin change or  tenderness.     Left: No inverted nipple, mass, nipple discharge, skin change or tenderness.  Abdominal:     General: Bowel sounds are normal. There is no distension.     Palpations: Abdomen is soft. Abdomen is not rigid. There is no fluid wave or mass.     Tenderness: There is no abdominal tenderness. There is no guarding or rebound.  Lymphadenopathy:     Head:     Right side of head: No submental, submandibular, tonsillar, preauricular, posterior auricular or occipital adenopathy.     Left side of head: No submental, submandibular, tonsillar, preauricular, posterior auricular or occipital adenopathy.     Cervical: No cervical adenopathy.     Right cervical: No superficial, deep or posterior cervical adenopathy.    Left cervical: No superficial, deep or posterior cervical adenopathy.  Skin:    General: Skin is warm and dry.  Neurological:     Mental Status: She is alert.  Psychiatric:        Speech: Speech normal.        Behavior: Behavior normal.        Thought Content: Thought content normal.       Assessment & Plan:   Problem List Items Addressed This Visit       Other   Palpitations    Episode 2 weeks ago during period of stress. Since resolved.  EKG today showed Sinus Rhythm.  Pending thorough lab for electrolyte disturbances, thyroid disorder. Referral to cardiology for further evaluation consideration of holter monitor, evaluation for structural heart disease.        Relevant Orders   EKG 12-Lead (Completed)   TSH (Completed)   CBC with Differential/Platelet (Completed)   Comprehensive metabolic panel (Completed)   Magnesium (Completed)   Ambulatory referral to Cardiology   Routine physical examination    CBE performed. She will return for Pap smear as on menses. Encouraged continued exercise.        Relevant Orders   TSH (Completed)   CBC with Differential/Platelet (Completed)   Comprehensive metabolic panel (Completed)   Hemoglobin A1c (Completed)    Hepatitis C antibody (Completed)   HIV Antibody (routine testing w rflx) (Completed)   Lipid panel (Completed)   VITAMIN D 25 Hydroxy (Vit-D Deficiency, Fractures) (Completed)     I am having Myrtha Logue maintain her ascorbic acid.   No orders of the defined types were placed in this encounter.   Return precautions given.   Risks, benefits, and alternatives of the medications and treatment plan prescribed today were discussed, and patient expressed understanding.   Education regarding symptom management and diagnosis given to patient on AVS.   Continue to follow with Allegra Grana,  FNP for routine health maintenance.   Sinia Tillett and I agreed with plan.   Rennie Plowman, FNP

## 2021-06-26 NOTE — Patient Instructions (Addendum)
Referral to cardiology Let us know if you dont hear back within a week in regards to an appointment being scheduled.  Going forward, please ensure we offer and you request a [place] interpretor at every visit.    Palpitations Palpitations are feelings that your heartbeat is irregular or is faster than normal. It may feel like your heart is fluttering or skipping a beat. Palpitations are usually not a serious problem. They may be caused by many things, including smoking, caffeine, alcohol, stress, and certain medicines or drugs. Most causes of palpitations are not serious. However, some palpitations can be a sign of a serious problem. You may need further tests to rule outserious medical problems. Follow these instructions at home:     Pay attention to any changes in your condition. Take these actions to helpmanage your symptoms: Eating and drinking Avoid foods and drinks that may cause palpitations. These may include: Caffeinated coffee, tea, soft drinks, diet pills, and energy drinks. Chocolate. Alcohol. Lifestyle Take steps to reduce your stress and anxiety. Things that can help you relax include: Yoga. Mind-body activities, such as deep breathing, meditation, or using words and images to create positive thoughts (guided imagery). Physical activity, such as swimming, jogging, or walking. Tell your health care provider if your palpitations increase with activity. If you have chest pain or shortness of breath with activity, do not continue the activity until you are seen by your health care provider. Biofeedback. This is a method that helps you learn to use your mind to control things in your body, such as your heartbeat. Do not use drugs, including cocaine or ecstasy. Do not use marijuana. Get plenty of rest and sleep. Keep a regular bed time. General instructions Take over-the-counter and prescription medicines only as told by your health care provider. Do not use any products that  contain nicotine or tobacco, such as cigarettes and e-cigarettes. If you need help quitting, ask your health care provider. Keep all follow-up visits as told by your health care provider. This is important. These may include visits for further testing if palpitations do not go away or get worse. Contact a health care provider if you: Continue to have a fast or irregular heartbeat after 24 hours. Notice that your palpitations occur more often. Get help right away if you: Have chest pain or shortness of breath. Have a severe headache. Feel dizzy or you faint. Summary Palpitations are feelings that your heartbeat is irregular or is faster than normal. It may feel like your heart is fluttering or skipping a beat. Palpitations may be caused by many things, including smoking, caffeine, alcohol, stress, certain medicines, and drugs. Although most causes of palpitations are not serious, some causes can be a sign of a serious medical problem. Get help right away if you faint or have chest pain, shortness of breath, a severe headache, or dizziness. This information is not intended to replace advice given to you by your health care provider. Make sure you discuss any questions you have with your healthcare provider. Document Revised: 01/13/2018 Document Reviewed: 01/13/2018 Elsevier Patient Education  2022 Elsevier Inc. Health Maintenance, Female Adopting a healthy lifestyle and getting preventive care are important in promoting health and wellness. Ask your health care provider about: The right schedule for you to have regular tests and exams. Things you can do on your own to prevent diseases and keep yourself healthy. What should I know about diet, weight, and exercise? Eat a healthy diet  Eat a diet that includes  plenty of vegetables, fruits, low-fat dairy products, and lean protein. Do not eat a lot of foods that are high in solid fats, added sugars, or sodium.  Maintain a healthy weight Body  mass index (BMI) is used to identify weight problems. It estimates body fat based on height and weight. Your health care provider can help determineyour BMI and help you achieve or maintain a healthy weight. Get regular exercise Get regular exercise. This is one of the most important things you can do for your health. Most adults should: Exercise for at least 150 minutes each week. The exercise should increase your heart rate and make you sweat (moderate-intensity exercise). Do strengthening exercises at least twice a week. This is in addition to the moderate-intensity exercise. Spend less time sitting. Even light physical activity can be beneficial. Watch cholesterol and blood lipids Have your blood tested for lipids and cholesterol at 43 years of age, then havethis test every 5 years. Have your cholesterol levels checked more often if: Your lipid or cholesterol levels are high. You are older than 43 years of age. You are at high risk for heart disease. What should I know about cancer screening? Depending on your health history and family history, you may need to have cancer screening at various ages. This may include screening for: Breast cancer. Cervical cancer. Colorectal cancer. Skin cancer. Lung cancer. What should I know about heart disease, diabetes, and high blood pressure? Blood pressure and heart disease High blood pressure causes heart disease and increases the risk of stroke. This is more likely to develop in people who have high blood pressure readings, are of African descent, or are overweight. Have your blood pressure checked: Every 3-5 years if you are 53-100 years of age. Every year if you are 72 years old or older. Diabetes Have regular diabetes screenings. This checks your fasting blood sugar level. Have the screening done: Once every three years after age 40 if you are at a normal weight and have a low risk for diabetes. More often and at a younger age if you are  overweight or have a high risk for diabetes. What should I know about preventing infection? Hepatitis B If you have a higher risk for hepatitis B, you should be screened for this virus. Talk with your health care provider to find out if you are at risk forhepatitis B infection. Hepatitis C Testing is recommended for: Everyone born from [birth year] through 1963. Anyone with known risk factors for hepatitis C. Sexually transmitted infections (STIs) Get screened for STIs, including gonorrhea and chlamydia, if: You are sexually active and are younger than 43 years of age. You are older than 43 years of age and your health care provider tells you that you are at risk for this type of infection. Your sexual activity has changed since you were last screened, and you are at increased risk for chlamydia or gonorrhea. Ask your health care provider if you are at risk. Ask your health care provider about whether you are at high risk for HIV. Your health care provider may recommend a prescription medicine to help prevent HIV infection. If you choose to take medicine to prevent HIV, you should first get tested for HIV. You should then be tested every 3 months for as long as you are taking the medicine. Pregnancy If you are about to stop having your period (premenopausal) and you may become pregnant, seek counseling before you get pregnant. Take 400 to 800 micrograms (mcg) of folic acid  every day if you become pregnant. Ask for birth control (contraception) if you want to prevent pregnancy. Osteoporosis and menopause Osteoporosis is a disease in which the bones lose minerals and strength with aging. This can result in bone fractures. If you are 53 years old or older, or if you are at risk for osteoporosis and fractures, ask your health care provider if you should: Be screened for bone loss. Take a calcium or vitamin D supplement to lower your risk of fractures. Be given hormone replacement therapy (HRT) to treat  symptoms of menopause. Follow these instructions at home: Lifestyle Do not use any products that contain nicotine or tobacco, such as cigarettes, e-cigarettes, and chewing tobacco. If you need help quitting, ask your health care provider. Do not use street drugs. Do not share needles. Ask your health care provider for help if you need support or information about quitting drugs. Alcohol use Do not drink alcohol if: Your health care provider tells you not to drink. You are pregnant, may be pregnant, or are planning to become pregnant. If you drink alcohol: Limit how much you use to 0-1 drink a day. Limit intake if you are breastfeeding. Be aware of how much alcohol is in your drink. In the [place] one drink equals one 12 oz bottle of beer (355 mL), one 5 oz glass of wine (148 mL), or one 1 oz glass of hard liquor (44 mL). General instructions Schedule regular health, dental, and eye exams. Stay current with your vaccines. Tell your health care provider if: You often feel depressed. You have ever been abused or do not feel safe at home. Summary Adopting a healthy lifestyle and getting preventive care are important in promoting health and wellness. Follow your health care provider's instructions about healthy diet, exercising, and getting tested or screened for diseases. Follow your health care provider's instructions on monitoring your cholesterol and blood pressure. This information is not intended to replace advice given to you by your health care provider. Make sure you discuss any questions you have with your healthcare provider. Document Revised: 11/24/2018 Document Reviewed: 11/24/2018 Elsevier Patient Education  2022 [employer].

## 2021-06-27 LAB — HEPATITIS C ANTIBODY
Hepatitis C Ab: NONREACTIVE
SIGNAL TO CUT-OFF: 0.01 (ref <1.00)

## 2021-06-27 LAB — HIV ANTIBODY (ROUTINE TESTING W REFLEX): HIV 1&2 Ab, 4th Generation: NONREACTIVE

## 2021-07-09 ENCOUNTER — Other Ambulatory Visit: Payer: Self-pay

## 2021-07-09 ENCOUNTER — Ambulatory Visit
Admission: RE | Admit: 2021-07-09 | Discharge: 2021-07-09 | Disposition: A | Discharge status: Home / Self Care | Payer: BC Managed Care – PPO | Source: Ambulatory Visit | Attending: Family | Admitting: Family

## 2021-07-09 DIAGNOSIS — Z1231 Encounter for screening mammogram for malignant neoplasm of breast: Secondary | ICD-10-CM | POA: Insufficient documentation

## 2021-08-09 ENCOUNTER — Ambulatory Visit: Payer: BC Managed Care – PPO | Admitting: Cardiology

## 2021-08-13 ENCOUNTER — Ambulatory Visit: Payer: BC Managed Care – PPO | Admitting: Family

## 2021-08-16 DIAGNOSIS — H40013 Open angle with borderline findings, low risk, bilateral: Secondary | ICD-10-CM | POA: Diagnosis not present

## 2021-08-30 ENCOUNTER — Other Ambulatory Visit: Payer: Self-pay

## 2021-08-30 ENCOUNTER — Encounter: Payer: Self-pay | Admitting: Cardiology

## 2021-08-30 ENCOUNTER — Ambulatory Visit: Payer: BC Managed Care – PPO | Admitting: Cardiology

## 2021-08-30 VITALS — BP 116/72 | HR 81 | Ht 64.0 in | Wt 139.0 lb

## 2021-08-30 DIAGNOSIS — R002 Palpitations: Secondary | ICD-10-CM

## 2021-08-30 NOTE — Progress Notes (Signed)
Cardiology Office Note:    Date:  08/30/2021   ID:  Jordan, Schmidt 31-May-1976, MRN [id number]  PCP:  Allegra Grana, FNP   [facility] Providers Cardiologist:  None     Referring MD: Allegra Grana, FNP   Chief Complaint  Patient presents with   New Patient (Initial Visit)    Referred by PCP for Palpitations. Meds reviewed verbally with patient.     History of Present Illness:    Jordan Schmidt is a 45 y.o. female with significant past medical history who presents due to palpitations.  Patient states she lost her aunt about 3 months ago and was in a state of shock.  Noticed her heart rates were beating fast, endorsed palpitations.  Overall symptoms have improved over the past 2 to 3 months.  Last episode of palpitations was maybe a week ago.  1 occurrence over the past 2 weeks.  Denies dizziness.  She was told she had extra heartbeats when she was 29.  She plans to go to [place] in about 2 weeks.  She otherwise feels well, has no other concerns at this time.  Past Medical History:  Diagnosis Date   Heart murmur    UTI (urinary tract infection)     Past Surgical History:  Procedure Laterality Date   UTERINE FIBROID SURGERY      Current Medications: Current Meds  Medication Sig   ascorbic acid (VITAMIN C) 1000 MG tablet Take 1 tablet by mouth daily.   cholecalciferol (VITAMIN D3) 25 MCG (1000 UNIT) tablet Take 1,000 Units by mouth daily.     Allergies:   Patient has no known allergies.   Social History   Socioeconomic History   Marital status: Married    Spouse name: Not on file   Number of children: Not on file   Years of education: Not on file   Highest education level: Not on file  Occupational History   Not on file  Tobacco Use   Smoking status: Never   Smokeless tobacco: Never  Substance and Sexual Activity   Alcohol use: No   Drug use: No   Sexual activity: Yes    Partners: Male  Other Topics Concern   Not on file  Social History  Narrative   Stays at home   Had been pharmacist in [place]   Moved here 03/2017 from [place].    Married- husband at [employer]   No children      Social Determinants of Health   Financial Resource Strain: Not on file  Food Insecurity: Not on file  Transportation Needs: Not on file  Physical Activity: Not on file  Stress: Not on file  Social Connections: Not on file     Family History: The patient's family history includes Colon cancer (age of onset: 19) in her paternal uncle; Heart disease in her maternal grandfather; Stroke in her maternal grandmother. There is no history of Breast cancer.  ROS:   Please see the history of present illness.     All other systems reviewed and are negative.  EKGs/Labs/Other Studies Reviewed:    The following studies were reviewed today:   EKG:  EKG is  ordered today.  The ekg ordered today demonstrates normal sinus rhythm  Recent Labs: 06/26/2021: ALT 12; BUN 14; Creatinine, Ser 0.80; Hemoglobin 14.7; Magnesium 2.0; Platelets 276.0; Potassium 4.2; Sodium 138; TSH 2.03  Recent Lipid Panel    Component Value Date/Time   CHOL 172 06/26/2021 0938   CHOL  183 05/31/2018 0851   TRIG 150.0 (H) 06/26/2021 0938   HDL 49.10 06/26/2021 0938   HDL 55 05/31/2018 0851   CHOLHDL 4 06/26/2021 0938   VLDL 30.0 06/26/2021 0938   LDLCALC 93 06/26/2021 0938   LDLCALC 102 (H) 05/31/2018 0851     Risk Assessment/Calculations:          Physical Exam:    VS:  BP 116/72 (BP Location: Left Arm, Patient Position: Sitting, Cuff Size: Normal)   Pulse 81   Ht 5\' 4"  (1.626 m)   Wt 139 lb (63 kg)   BMI 23.86 kg/m     Wt Readings from Last 3 Encounters:  08/30/21 139 lb (63 kg)  06/26/21 138 lb 9.6 oz (62.9 kg)  06/25/20 141 lb 12.8 oz (64.3 kg)     GEN:  Well nourished, well developed in no acute distress HEENT: Normal NECK: No JVD; No carotid bruits LYMPHATICS: No lymphadenopathy CARDIAC: RRR, no murmurs, rubs, gallops RESPIRATORY:  Clear to  auscultation without rales, wheezing or rhonchi  ABDOMEN: Soft, non-tender, non-distended MUSCULOSKELETAL:  No edema; No deformity  SKIN: Warm and dry NEUROLOGIC:  Alert and oriented x 3 PSYCHIATRIC:  Normal affect   ASSESSMENT:    1. Palpitations    PLAN:    In order of problems listed above:  Palpitations, symptoms overall improving.  This could be related to stress of losing a loved one since her aunt passed immediately when symptoms began.  They now rarely alcohol with 1 occurrence over the past 2 weeks.  Cardiac monitor was considered but patient plans to leave for [place] in 2 weeks and also plans to follow-up with cardiologist while in [place].  Since symptoms are improving, I do not believe testing is warranted at this point.  If symptoms persist or become worse, consider cardiac monitor.  Follow-up prn     Medication Adjustments/Labs and Tests Ordered: Current medicines are reviewed at length with the patient today.  Concerns regarding medicines are outlined above.  No orders of the defined types were placed in this encounter.  No orders of the defined types were placed in this encounter.   There are no Patient Instructions on file for this visit.   Signed, [place], MD  08/30/2021 12:14 PM    [facility]

## 2021-08-30 NOTE — Patient Instructions (Signed)
Medication Instructions:  Your physician recommends that you continue on your current medications as directed. Please refer to the Current Medication list given to you today.  *If you need a refill on your cardiac medications before your next appointment, please call your pharmacy*   Lab Work: None ordered If you have labs (blood work) drawn today and your tests are completely normal, you will receive your results only by: MyChart Message (if you have MyChart) OR A paper copy in the mail If you have any lab test that is abnormal or we need to change your treatment, we will call you to review the results.   Testing/Procedures: None ordered   Follow-Up: At [facility], you and your health needs are our priority.  As part of our continuing mission to provide you with exceptional heart care, we have created designated Provider Care Teams.  These Care Teams include your primary Cardiologist (physician) and Advanced Practice Providers (APPs -  Physician Assistants and Nurse Practitioners) who all work together to provide you with the care you need, when you need it.  We recommend signing up for the patient portal called "MyChart".  Sign up information is provided on this After Visit Summary.  MyChart is used to connect with patients for Virtual Visits (Telemedicine).  Patients are able to view lab/test results, encounter notes, upcoming appointments, etc.  Non-urgent messages can be sent to your provider as well.   To learn more about what you can do with MyChart, go to [email or web address].    Your next appointment:   Follow up as needed   The format for your next appointment:   In Person  Provider:   You may see Dr. Agbor-Etang or one of the following Advanced Practice Providers on your designated Care Team:   Christopher Berge, NP Ryan Dunn, PA-C Jacquelyn Visser, PA-C Cadence Furth, PA-C   Other Instructions   

## 2021-09-03 ENCOUNTER — Other Ambulatory Visit (HOSPITAL_COMMUNITY)
Admission: RE | Admit: 2021-09-03 | Discharge: 2021-09-03 | Disposition: A | Discharge status: Home / Self Care | Payer: BC Managed Care – PPO | Source: Ambulatory Visit | Attending: Family | Admitting: Family

## 2021-09-03 ENCOUNTER — Encounter: Payer: Self-pay | Admitting: Family

## 2021-09-03 ENCOUNTER — Other Ambulatory Visit: Payer: Self-pay

## 2021-09-03 ENCOUNTER — Ambulatory Visit: Payer: BC Managed Care – PPO | Admitting: Family

## 2021-09-03 VITALS — BP 106/78 | HR 94 | Temp 98.2°F | Ht 64.0 in | Wt 140.2 lb

## 2021-09-03 DIAGNOSIS — Z124 Encounter for screening for malignant neoplasm of cervix: Secondary | ICD-10-CM | POA: Diagnosis not present

## 2021-09-03 DIAGNOSIS — Z0000 Encounter for general adult medical examination without abnormal findings: Secondary | ICD-10-CM

## 2021-09-03 NOTE — Assessment & Plan Note (Signed)
Pap collected. 

## 2021-09-03 NOTE — Patient Instructions (Signed)
Nice to see you!   

## 2021-09-03 NOTE — Progress Notes (Signed)
Subjective:    Patient ID: Jordan Schmidt, female    DOB: [birth year], 45 y.o.   MRN: [id number]  CC: Jordan Schmidt is a 45 y.o. female who presents today for follow up.   HPI: Accompanied by interpreter.  Patient has a history of dysmenorrhea, uterine fibroids.  Last pap 2019.   Here today for Pap smear as she had been on her menses during her annual CPE this year.  No pelvic pain.   Menses are monthly, regular.   Evaluated by Dr. Sandie Ano, cardiology for palpitations 08/2021.  Cardiac monitor considered however  deferred that she will see cardiologist while in [place] HISTORY:  Past Medical History:  Diagnosis Date   Heart murmur    UTI (urinary tract infection)    Past Surgical History:  Procedure Laterality Date   UTERINE FIBROID SURGERY     Family History  Problem Relation Age of Onset   Stroke Maternal Grandmother    Heart disease Maternal Grandfather    Colon cancer Paternal Uncle 41   Breast cancer Neg Hx     Allergies: Patient has no known allergies. Current Outpatient Medications on File Prior to Visit  Medication Sig Dispense Refill   ascorbic acid (VITAMIN C) 1000 MG tablet Take 1 tablet by mouth daily.     cholecalciferol (VITAMIN D3) 25 MCG (1000 UNIT) tablet Take 1,000 Units by mouth daily.     No current facility-administered medications on file prior to visit.    Social History   Tobacco Use   Smoking status: Never   Smokeless tobacco: Never  Substance Use Topics   Alcohol use: No   Drug use: No    Review of Systems  Constitutional:  Negative for chills and fever.  Respiratory:  Negative for cough.   Cardiovascular:  Negative for chest pain and palpitations.  Gastrointestinal:  Negative for nausea and vomiting.     Objective:    BP 106/78 (BP Location: Left Arm, Patient Position: Sitting, Cuff Size: Normal)   Pulse 94   Temp 98.2 F (36.8 C) (Oral)   Ht 5\' 4"  (1.626 m)   Wt 140 lb 3.2 oz (63.6 kg)   SpO2 98%   BMI 24.07 kg/m  BP Readings  from Last 3 Encounters:  09/03/21 106/78  08/30/21 116/72  06/26/21 118/82   Wt Readings from Last 3 Encounters:  09/03/21 140 lb 3.2 oz (63.6 kg)  08/30/21 139 lb (63 kg)  06/26/21 138 lb 9.6 oz (62.9 kg)    Physical Exam Vitals reviewed.  Constitutional:      Appearance: She is well-developed.  Eyes:     Conjunctiva/sclera: Conjunctivae normal.  Cardiovascular:     Rate and Rhythm: Normal rate and regular rhythm.     Pulses: Normal pulses.     Heart sounds: Normal heart sounds.  Pulmonary:     Effort: Pulmonary effort is normal.     Breath sounds: Normal breath sounds. No wheezing, rhonchi or rales.  Genitourinary:    Pubic Area: No rash.      Labia:        Right: No rash.        Left: No rash.      Vagina: No foreign body. No erythema, bleeding or lesions.     Cervix: No discharge or erythema.     Uterus: Not enlarged and not tender.      Adnexa:        Right: No mass, tenderness or fullness.  Left: No mass, tenderness or fullness.       Comments: Pap collected cervical os Skin:    General: Skin is warm and dry.  Neurological:     Mental Status: She is alert.  Psychiatric:        Speech: Speech normal.        Behavior: Behavior normal.        Thought Content: Thought content normal.       Assessment & Plan:   Problem List Items Addressed This Visit       Other   Routine physical examination - Primary   Relevant Orders   Cytology - PAP( [place])   Screening for cervical cancer    Pap collected.         I am having Leiya Bukowski maintain her ascorbic acid and cholecalciferol.   No orders of the defined types were placed in this encounter.   Return precautions given.   Risks, benefits, and alternatives of the medications and treatment plan prescribed today were discussed, and patient expressed understanding.   Education regarding symptom management and diagnosis given to patient on AVS.  Continue to follow with Allegra Grana,  FNP for routine health maintenance.   Necola Worlds and I agreed with plan.   Rennie Plowman, FNP

## 2021-09-04 LAB — CYTOLOGY - PAP
Adequacy: ABSENT
Comment: NEGATIVE
Diagnosis: NEGATIVE
High risk HPV: NEGATIVE

## 2021-10-18 ENCOUNTER — Encounter: Payer: BC Managed Care – PPO | Admitting: Family

## 2021-11-18 DIAGNOSIS — Z0189 Encounter for other specified special examinations: Secondary | ICD-10-CM | POA: Diagnosis not present

## 2021-11-19 DIAGNOSIS — Z713 Dietary counseling and surveillance: Secondary | ICD-10-CM | POA: Diagnosis not present

## 2021-11-19 DIAGNOSIS — Z043 Encounter for examination and observation following other accident: Secondary | ICD-10-CM | POA: Diagnosis not present

## 2021-11-19 DIAGNOSIS — E785 Hyperlipidemia, unspecified: Secondary | ICD-10-CM | POA: Diagnosis not present

## 2022-01-27 DIAGNOSIS — N911 Secondary amenorrhea: Secondary | ICD-10-CM | POA: Diagnosis not present

## 2022-06-11 ENCOUNTER — Other Ambulatory Visit: Payer: Self-pay | Admitting: Family

## 2022-06-11 DIAGNOSIS — Z1231 Encounter for screening mammogram for malignant neoplasm of breast: Secondary | ICD-10-CM

## 2022-06-27 ENCOUNTER — Encounter: Payer: Self-pay | Admitting: Family

## 2022-06-27 ENCOUNTER — Ambulatory Visit (INDEPENDENT_AMBULATORY_CARE_PROVIDER_SITE_OTHER): Payer: BC Managed Care – PPO | Admitting: Family

## 2022-06-27 ENCOUNTER — Other Ambulatory Visit (HOSPITAL_COMMUNITY)
Admission: RE | Admit: 2022-06-27 | Discharge: 2022-06-27 | Disposition: A | Discharge status: Home / Self Care | Payer: BC Managed Care – PPO | Source: Ambulatory Visit | Attending: Family | Admitting: Family

## 2022-06-27 VITALS — BP 128/82 | HR 96 | Temp 98.4°F | Ht 64.0 in | Wt 139.6 lb

## 2022-06-27 DIAGNOSIS — Z124 Encounter for screening for malignant neoplasm of cervix: Secondary | ICD-10-CM

## 2022-06-27 DIAGNOSIS — Z0000 Encounter for general adult medical examination without abnormal findings: Secondary | ICD-10-CM

## 2022-06-27 LAB — CBC WITH DIFFERENTIAL/PLATELET
Basophils Absolute: 0 10*3/uL (ref 0.0–0.1)
Basophils Relative: 0.8 % (ref 0.0–3.0)
Eosinophils Absolute: 0.3 10*3/uL (ref 0.0–0.7)
Eosinophils Relative: 5.2 % — ABNORMAL HIGH (ref 0.0–5.0)
HCT: 42.5 % (ref 36.0–46.0)
Hemoglobin: 13.9 g/dL (ref 12.0–15.0)
Lymphocytes Relative: 30.2 % (ref 12.0–46.0)
Lymphs Abs: 1.7 10*3/uL (ref 0.7–4.0)
MCHC: 32.8 g/dL (ref 30.0–36.0)
MCV: 95.5 fl (ref 78.0–100.0)
Monocytes Absolute: 0.2 10*3/uL (ref 0.1–1.0)
Monocytes Relative: 4.3 % (ref 3.0–12.0)
Neutro Abs: 3.4 10*3/uL (ref 1.4–7.7)
Neutrophils Relative %: 59.5 % (ref 43.0–77.0)
Platelets: 309 10*3/uL (ref 150.0–400.0)
RBC: 4.45 Mil/uL (ref 3.87–5.11)
RDW: 12.6 % (ref 11.5–15.5)
WBC: 5.7 10*3/uL (ref 4.0–10.5)

## 2022-06-27 LAB — COMPREHENSIVE METABOLIC PANEL
ALT: 14 U/L (ref 0–35)
AST: 15 U/L (ref 0–37)
Albumin: 4.4 g/dL (ref 3.5–5.2)
Alkaline Phosphatase: 40 U/L (ref 39–117)
BUN: 12 mg/dL (ref 6–23)
CO2: 23 mEq/L (ref 19–32)
Calcium: 8.8 mg/dL (ref 8.4–10.5)
Chloride: 103 mEq/L (ref 96–112)
Creatinine, Ser: 0.83 mg/dL (ref 0.40–1.20)
GFR: 86.24 mL/min (ref >60.00)
Glucose, Bld: 95 mg/dL (ref 70–99)
Potassium: 4 mEq/L (ref 3.5–5.1)
Sodium: 137 mEq/L (ref 135–145)
Total Bilirubin: 0.5 mg/dL (ref 0.2–1.2)
Total Protein: 6.8 g/dL (ref 6.0–8.3)

## 2022-06-27 LAB — LIPID PANEL
Cholesterol: 161 mg/dL (ref 0–200)
HDL: 50.6 mg/dL (ref >39.00)
LDL Cholesterol: 77 mg/dL (ref 0–99)
NonHDL: 110.26
Total CHOL/HDL Ratio: 3
Triglycerides: 164 mg/dL — ABNORMAL HIGH (ref 0.0–149.0)
VLDL: 32.8 mg/dL (ref 0.0–40.0)

## 2022-06-27 LAB — HEMOGLOBIN A1C: Hgb A1c MFr Bld: 5.6 % (ref 4.6–6.5)

## 2022-06-27 LAB — VITAMIN D 25 HYDROXY (VIT D DEFICIENCY, FRACTURES): VITD: 31.75 ng/mL (ref 30.00–100.00)

## 2022-06-27 LAB — TSH: TSH: 2.37 u[IU]/mL (ref 0.35–5.50)

## 2022-06-27 NOTE — Patient Instructions (Signed)

## 2022-06-27 NOTE — Progress Notes (Signed)
Subjective:    Patient ID: Jordan Schmidt, female    DOB: 15-Dec-1976, 46 y.o.   MRN: [id number]  CC: Jordan Schmidt is a 46 y.o. female who presents today for physical exam.    HPI: Accompanied by translator. Feels well today.  No new complaints.   Menses are monthly. No pelvic pain.  She is taking OCP.   Consult with [facility] clinic GYN for secondary amenorrhea 01/27/22 She was started on provera.    Colorectal Cancer Screening: No FDR colon cancer.  Breast Cancer Screening: Mammogram scheduled Cervical Cancer Screening: due.  Transformation zone absent 9 months ago.  HPV, malignancy.  Lung Cancer Screening: Doesn't have 20 year pack year history and age > 50 years yo 27 years        Tetanus - UTD         Labs: Screening labs today. Exercise: Gets regular exercise, swimming, yoga. No cp.  Alcohol use:  none Smoking/tobacco use: Nonsmoker.     HISTORY:  Past Medical History:  Diagnosis Date   Heart murmur    UTI (urinary tract infection)     Past Surgical History:  Procedure Laterality Date   UTERINE FIBROID SURGERY     Family History  Problem Relation Age of Onset   Stroke Maternal Grandmother    Heart disease Maternal Grandfather    Colon cancer Paternal Uncle 25   Breast cancer Neg Hx       ALLERGIES: Patient has no known allergies.  Current Outpatient Medications on File Prior to Visit  Medication Sig Dispense Refill   ascorbic acid (VITAMIN C) 1000 MG tablet Take 1 tablet by mouth daily.     cholecalciferol (VITAMIN D3) 25 MCG (1000 UNIT) tablet Take 1,000 Units by mouth daily.     folic acid (FOLVITE) 1 MG tablet Take by mouth.     norgestimate-ethinyl estradiol (ORTHO-CYCLEN) 0.25-35 MG-MCG tablet Take 1 tablet by mouth daily.     No current facility-administered medications on file prior to visit.    Social History   Tobacco Use   Smoking status: Never   Smokeless tobacco: Never  Substance Use Topics   Alcohol use: No   Drug use: No     Review of Systems  Constitutional:  Negative for chills, fever and unexpected weight change.  HENT:  Negative for congestion.   Respiratory:  Negative for cough.   Cardiovascular:  Negative for chest pain, palpitations and leg swelling.  Gastrointestinal:  Negative for nausea and vomiting.  Genitourinary:  Negative for pelvic pain.  Musculoskeletal:  Negative for arthralgias and myalgias.  Skin:  Negative for rash.  Neurological:  Negative for headaches.  Hematological:  Negative for adenopathy.  Psychiatric/Behavioral:  Negative for confusion.       Objective:    BP 128/82 (BP Location: Left Arm, Patient Position: Sitting, Cuff Size: Normal)   Pulse 96   Temp 98.4 F (36.9 C) (Oral)   Ht 5\' 4"  (1.626 m)   Wt 139 lb 9.6 oz (63.3 kg)   LMP 05/29/2022   SpO2 98%   BMI 23.96 kg/m   BP Readings from Last 3 Encounters:  06/27/22 128/82  09/03/21 106/78  08/30/21 116/72   Wt Readings from Last 3 Encounters:  06/27/22 139 lb 9.6 oz (63.3 kg)  09/03/21 140 lb 3.2 oz (63.6 kg)  08/30/21 139 lb (63 kg)    Physical Exam Vitals reviewed.  Constitutional:      Appearance: Normal appearance. She is well-developed.  Eyes:  Conjunctiva/sclera: Conjunctivae normal.  Neck:     Thyroid: No thyroid mass or thyromegaly.  Cardiovascular:     Rate and Rhythm: Normal rate and regular rhythm.     Pulses: Normal pulses.     Heart sounds: Normal heart sounds.  Pulmonary:     Effort: Pulmonary effort is normal.     Breath sounds: Normal breath sounds. No wheezing, rhonchi or rales.  Chest:  Breasts:    Breasts are symmetrical.     Right: No inverted nipple, mass, nipple discharge, skin change or tenderness.     Left: No inverted nipple, mass, nipple discharge, skin change or tenderness.  Abdominal:     General: Bowel sounds are normal. There is no distension.     Palpations: Abdomen is soft. Abdomen is not rigid. There is no fluid wave or mass.     Tenderness: There is no  abdominal tenderness. There is no guarding or rebound.  Genitourinary:    Cervix: No cervical motion tenderness, discharge or friability.     Uterus: Not enlarged, not fixed and not tender.      Adnexa:        Right: No mass, tenderness or fullness.         Left: No mass, tenderness or fullness.       Comments: Pap performed. No CMT. Unable to appreciated ovaries. Lymphadenopathy:     Head:     Right side of head: No submental, submandibular, tonsillar, preauricular, posterior auricular or occipital adenopathy.     Left side of head: No submental, submandibular, tonsillar, preauricular, posterior auricular or occipital adenopathy.     Cervical:     Right cervical: No superficial, deep or posterior cervical adenopathy.    Left cervical: No superficial, deep or posterior cervical adenopathy.     Upper Body:     Right upper body: No pectoral adenopathy.     Left upper body: No pectoral adenopathy.  Skin:    General: Skin is warm and dry.  Neurological:     Mental Status: She is alert.  Psychiatric:        Speech: Speech normal.        Behavior: Behavior normal.        Thought Content: Thought content normal.        Assessment & Plan:   Problem List Items Addressed This Visit       Other   Routine physical examination - Primary    Clinical breast exam performed today.  Mammogram is scheduled.  Pap smear obtained.  Encouraged continued exercise.  We discussed colonoscopy at age 44 years of age.      Relevant Orders   CBC with Differential/Platelet   Comprehensive metabolic panel   Hemoglobin A1c   Lipid panel   TSH   VITAMIN D 25 Hydroxy (Vit-D Deficiency, Fractures)     I am having Jordan Schmidt maintain her ascorbic acid, cholecalciferol, norgestimate-ethinyl estradiol, and folic acid.   No orders of the defined types were placed in this encounter.   Return precautions given.   Risks, benefits, and alternatives of the medications and treatment plan prescribed  today were discussed, and patient expressed understanding.   Education regarding symptom management and diagnosis given to patient on AVS.   Continue to follow with Jordan Grana, FNP for routine health maintenance.   Jordan Schmidt and I agreed with plan.   Jordan Plowman, FNP

## 2022-06-27 NOTE — Assessment & Plan Note (Addendum)
Clinical breast exam performed today.  Mammogram is scheduled.  Pap smear obtained.  Encouraged continued exercise.  We discussed colonoscopy at age 46 years of age.

## 2022-06-27 NOTE — Addendum Note (Signed)
Addended by: [place], Averleigh Savary on: 06/27/2022 10:43 AM   Modules accepted: Orders

## 2022-06-30 ENCOUNTER — Other Ambulatory Visit: Payer: Self-pay

## 2022-06-30 ENCOUNTER — Telehealth: Payer: Self-pay

## 2022-06-30 DIAGNOSIS — R899 Unspecified abnormal finding in specimens from other organs, systems and tissues: Secondary | ICD-10-CM

## 2022-06-30 NOTE — Progress Notes (Signed)
cbc

## 2022-06-30 NOTE — Telephone Encounter (Signed)
LVM to  call back to  schedule f/u and cbc

## 2022-07-04 ENCOUNTER — Telehealth: Payer: Self-pay | Admitting: Family

## 2022-07-04 NOTE — Telephone Encounter (Signed)
HPV not resulted on pap   Jordan Schmidt, jill, can this be added on         

## 2022-07-04 NOTE — Telephone Encounter (Signed)
HPV not resulted on pap   Jordan Schmidt, jill, can this be added on

## 2022-07-07 LAB — CYTOLOGY - PAP
Chlamydia: NEGATIVE
Comment: NEGATIVE
Comment: NEGATIVE
Comment: NEGATIVE
Comment: NORMAL
Diagnosis: NEGATIVE
High risk HPV: NEGATIVE
Neisseria Gonorrhea: NEGATIVE
Trichomonas: NEGATIVE

## 2022-07-07 NOTE — Telephone Encounter (Signed)
Today is the last day to add it if they still have the specimen. There is a from that needs to be faxed (usually by the CMA). And we need to call to confirm since its the last day. Will forward to [place] as well to ensure this gets addressed today.

## 2022-07-07 NOTE — Telephone Encounter (Signed)
HPV added per Cytology Sheree.

## 2022-07-08 ENCOUNTER — Telehealth: Payer: Self-pay

## 2022-07-08 NOTE — Telephone Encounter (Signed)
LMTCB to give patient negative HPV result that was added to PAP.

## 2022-07-08 NOTE — Telephone Encounter (Signed)
Noted  

## 2022-07-08 NOTE — Telephone Encounter (Signed)
Patient returned Donney Dice, CMA's call.  I read Sarah's message to patient per Donney Dice, CMA.

## 2022-07-10 ENCOUNTER — Ambulatory Visit
Admission: RE | Admit: 2022-07-10 | Discharge: 2022-07-10 | Disposition: A | Discharge status: Home / Self Care | Payer: BC Managed Care – PPO | Source: Ambulatory Visit | Attending: Family | Admitting: Family

## 2022-07-10 DIAGNOSIS — Z1231 Encounter for screening mammogram for malignant neoplasm of breast: Secondary | ICD-10-CM | POA: Insufficient documentation

## 2022-07-23 DIAGNOSIS — Z0100 Encounter for examination of eyes and vision without abnormal findings: Secondary | ICD-10-CM | POA: Diagnosis not present

## 2022-07-23 DIAGNOSIS — H40003 Preglaucoma, unspecified, bilateral: Secondary | ICD-10-CM | POA: Diagnosis not present

## 2022-08-14 ENCOUNTER — Other Ambulatory Visit: Payer: Self-pay

## 2022-08-14 ENCOUNTER — Telehealth: Payer: Self-pay | Admitting: Family

## 2022-08-14 DIAGNOSIS — R5383 Other fatigue: Secondary | ICD-10-CM

## 2022-08-14 NOTE — Telephone Encounter (Signed)
Patient has a lab appt 08/20/2022, there are no orders in. 

## 2022-08-14 NOTE — Telephone Encounter (Signed)
Orders are in

## 2022-08-17 ENCOUNTER — Encounter: Payer: Self-pay | Admitting: Family

## 2022-08-21 ENCOUNTER — Other Ambulatory Visit: Payer: BC Managed Care – PPO

## 2022-08-27 ENCOUNTER — Ambulatory Visit: Payer: BC Managed Care – PPO | Admitting: Family

## 2023-03-28 IMAGING — MG MM DIGITAL SCREENING BILAT W/ TOMO AND CAD
8 series · 9 of 24 positions shown · non-contrast
Comparison: Previous exam(s).

CLINICAL DATA: Screening.

EXAM:
DIGITAL SCREENING BILATERAL MAMMOGRAM WITH TOMOSYNTHESIS AND CAD
TECHNIQUE: Bilateral screening digital craniocaudal and mediolateral oblique
mammograms were obtained. Bilateral screening digital breast
tomosynthesis was performed. The images were evaluated with
computer-aided detection.

[R MLO synth-2D]
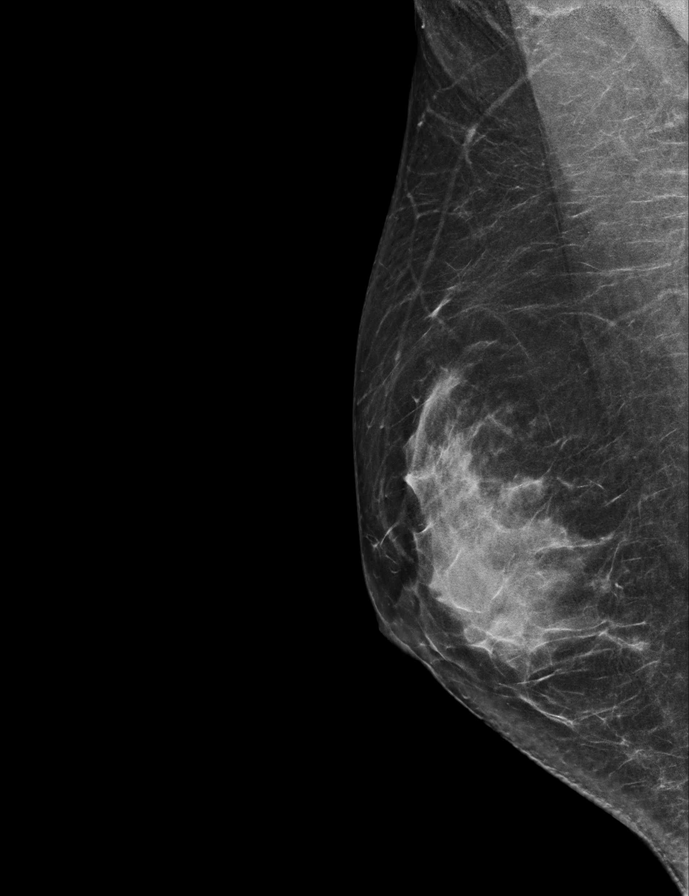

[L MLO synth-2D]
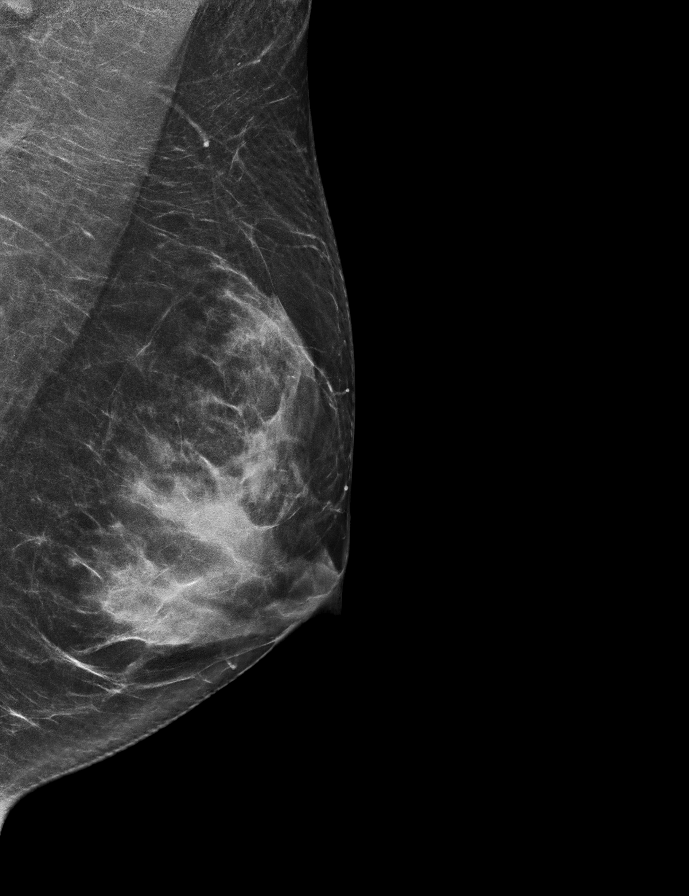

[L CC synth-2D]
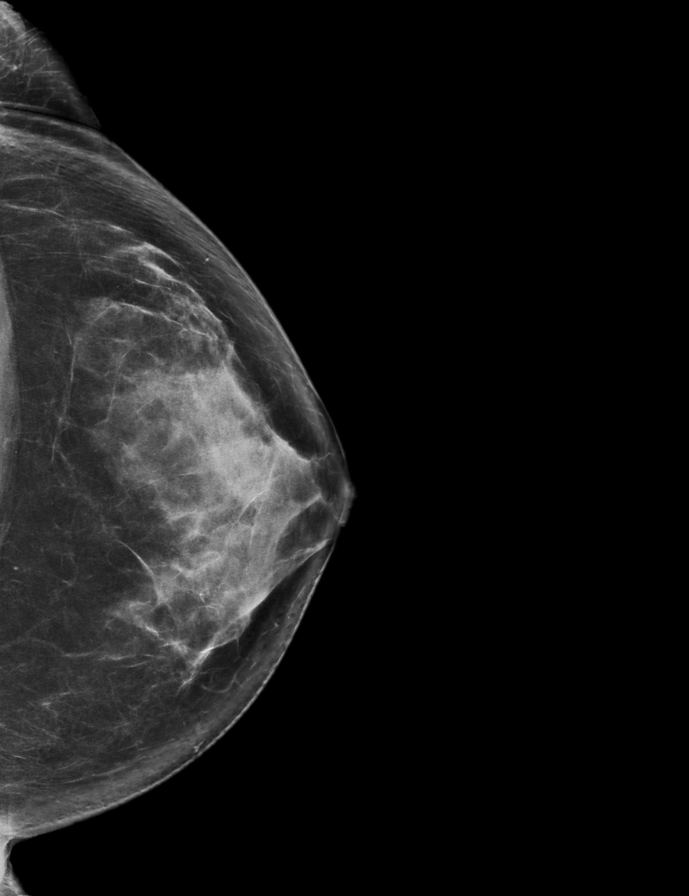

[R CC synth-2D]
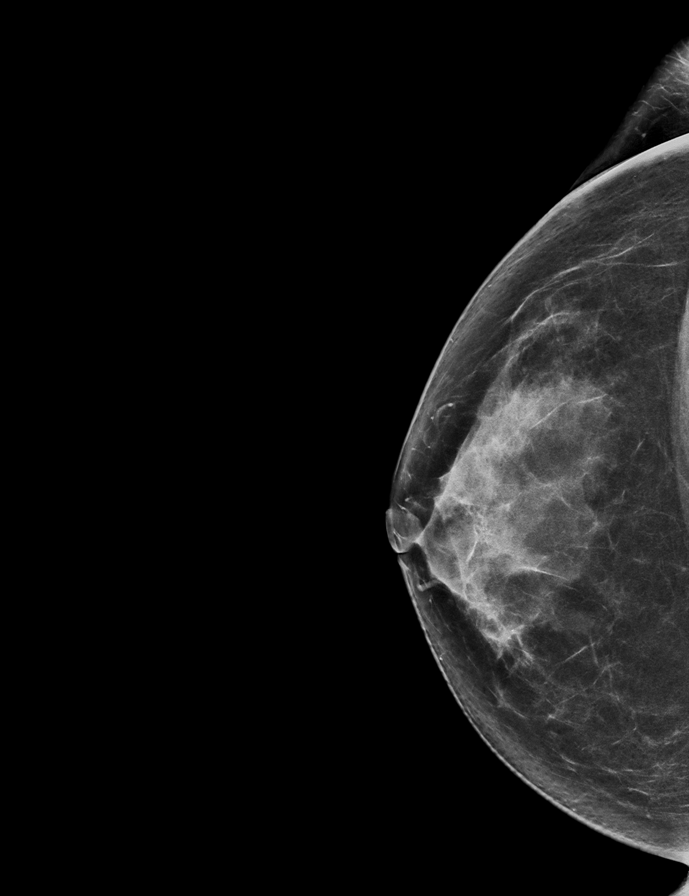

[R MLO tomo · 2 of 68 frames shown]
[frame 22/68]
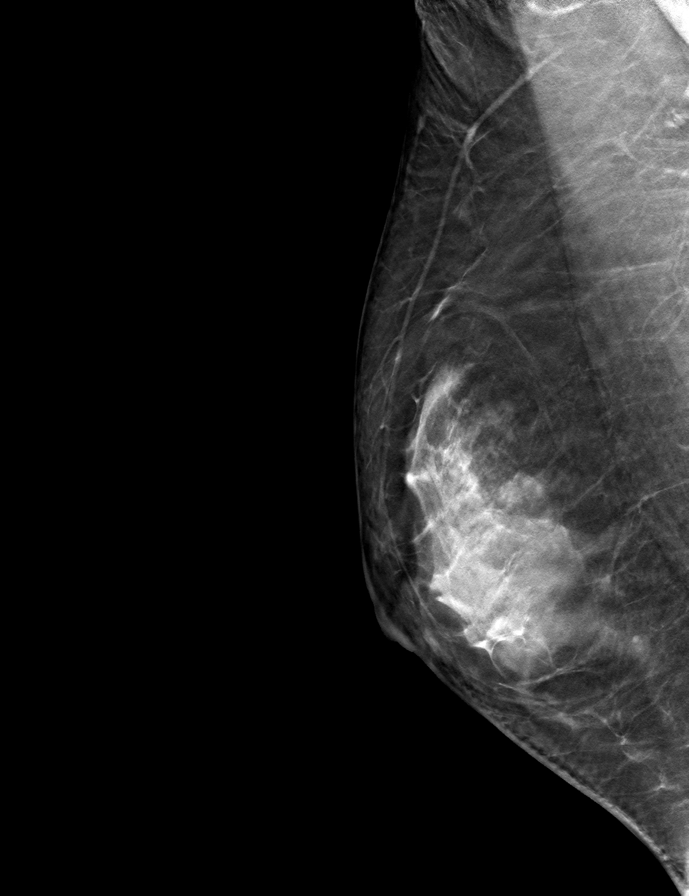
[frame 35/68]
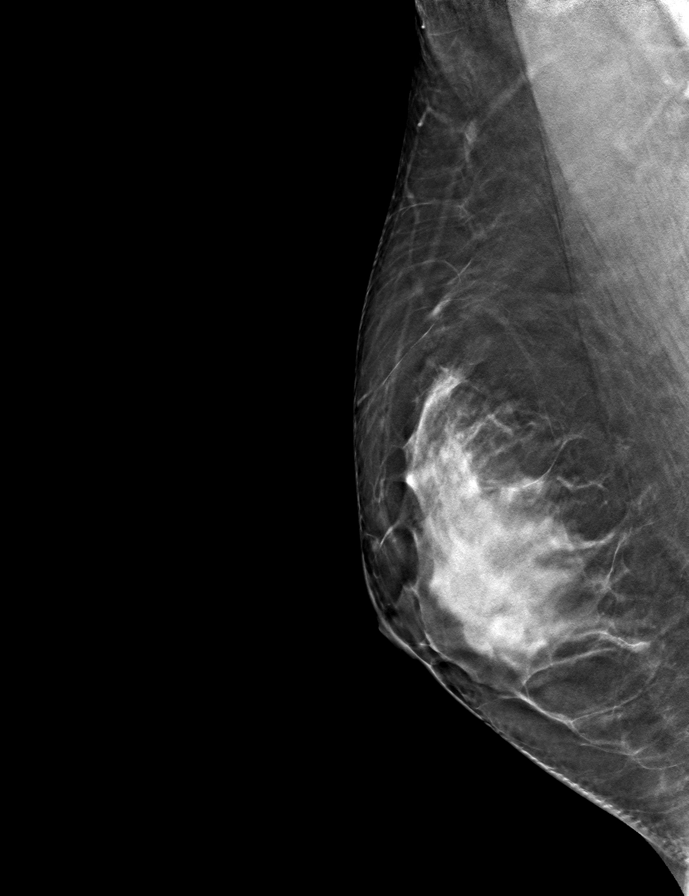

[L CC tomo · tomo slice 37/74.0]
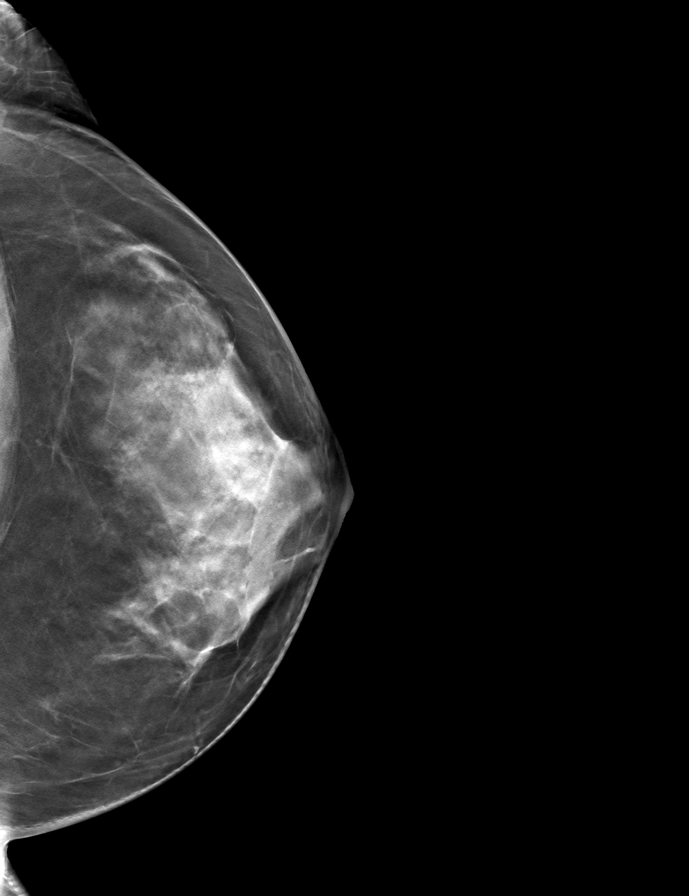

[R CC tomo · tomo slice 37/73.0]
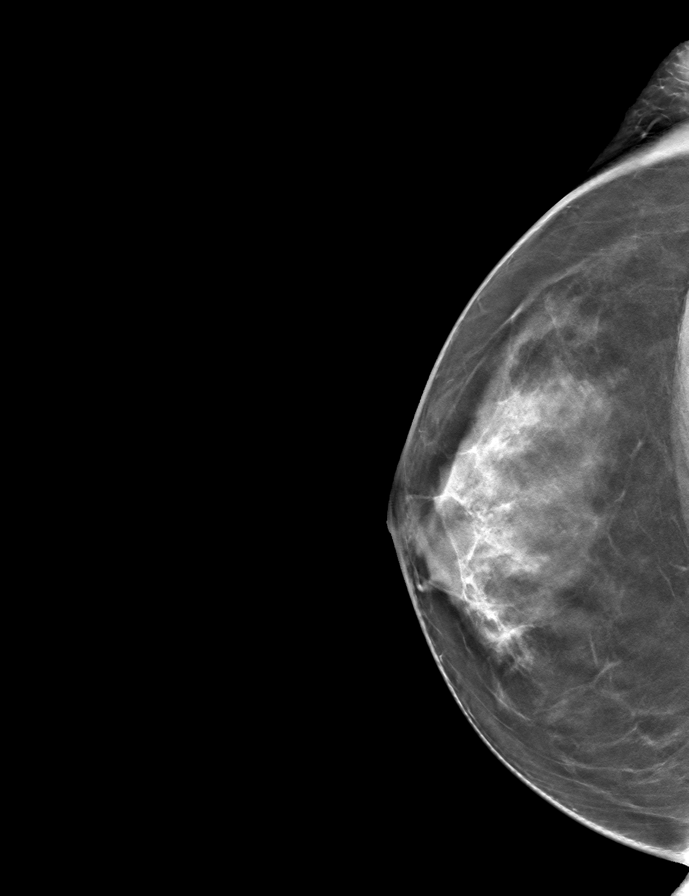

[L MLO tomo · tomo slice 33/66.0]
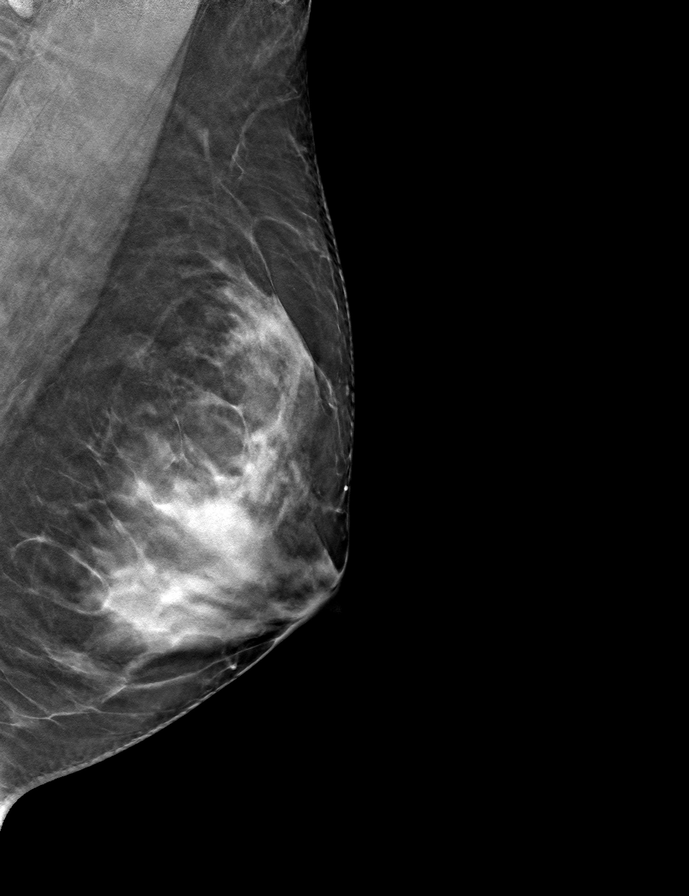

[9 of 24 positions shown; findings below may reference images not displayed]

ACR Breast Density Category c: The breast tissue is heterogeneously
dense, which may obscure small masses.
FINDINGS: There are no findings suspicious for malignancy.
IMPRESSION: No mammographic evidence of malignancy. A result letter of this
screening mammogram will be mailed directly to the patient.

RECOMMENDATION:
Screening mammogram in one year. (Code:[id number])

BI-RADS CATEGORY  1: Negative.

## 2023-06-01 ENCOUNTER — Other Ambulatory Visit: Payer: Self-pay | Admitting: Family

## 2023-06-01 DIAGNOSIS — Z1231 Encounter for screening mammogram for malignant neoplasm of breast: Secondary | ICD-10-CM

## 2023-07-03 ENCOUNTER — Encounter: Payer: BC Managed Care – PPO | Admitting: Family

## 2023-07-10 ENCOUNTER — Ambulatory Visit: Payer: BC Managed Care – PPO | Admitting: Family

## 2023-07-10 ENCOUNTER — Encounter: Payer: Self-pay | Admitting: Family

## 2023-07-10 VITALS — BP 120/78 | HR 104 | Temp 98.6°F | Ht 64.0 in | Wt 146.6 lb

## 2023-07-10 DIAGNOSIS — Z0000 Encounter for general adult medical examination without abnormal findings: Secondary | ICD-10-CM | POA: Diagnosis not present

## 2023-07-10 DIAGNOSIS — Z136 Encounter for screening for cardiovascular disorders: Secondary | ICD-10-CM | POA: Diagnosis not present

## 2023-07-10 DIAGNOSIS — Z13220 Encounter for screening for lipoid disorders: Secondary | ICD-10-CM

## 2023-07-10 LAB — COMPREHENSIVE METABOLIC PANEL
ALT: 11 U/L (ref 0–35)
AST: 15 U/L (ref 0–37)
Albumin: 4.1 g/dL (ref 3.5–5.2)
Alkaline Phosphatase: 42 U/L (ref 39–117)
BUN: 15 mg/dL (ref 6–23)
CO2: 26 mEq/L (ref 19–32)
Calcium: 8.7 mg/dL (ref 8.4–10.5)
Chloride: 102 mEq/L (ref 96–112)
Creatinine, Ser: 0.77 mg/dL (ref 0.40–1.20)
GFR: 93.69 mL/min (ref >60.00)
Glucose, Bld: 111 mg/dL — ABNORMAL HIGH (ref 70–99)
Potassium: 3.7 mEq/L (ref 3.5–5.1)
Sodium: 138 mEq/L (ref 135–145)
Total Bilirubin: 0.3 mg/dL (ref 0.2–1.2)
Total Protein: 6.8 g/dL (ref 6.0–8.3)

## 2023-07-10 LAB — LIPID PANEL
Cholesterol: 176 mg/dL (ref 0–200)
HDL: 50.4 mg/dL (ref >39.00)
NonHDL: 125.12
Total CHOL/HDL Ratio: 3
Triglycerides: 202 mg/dL — ABNORMAL HIGH (ref 0.0–149.0)
VLDL: 40.4 mg/dL — ABNORMAL HIGH (ref 0.0–40.0)

## 2023-07-10 LAB — CBC WITH DIFFERENTIAL/PLATELET
Basophils Absolute: 0 10*3/uL (ref 0.0–0.1)
Basophils Relative: 0.9 % (ref 0.0–3.0)
Eosinophils Absolute: 0.1 10*3/uL (ref 0.0–0.7)
Eosinophils Relative: 2 % (ref 0.0–5.0)
HCT: 40.3 % (ref 36.0–46.0)
Hemoglobin: 13.2 g/dL (ref 12.0–15.0)
Lymphocytes Relative: 33.8 % (ref 12.0–46.0)
Lymphs Abs: 1.9 10*3/uL (ref 0.7–4.0)
MCHC: 32.6 g/dL (ref 30.0–36.0)
MCV: 96.1 fl (ref 78.0–100.0)
Monocytes Absolute: 0.3 10*3/uL (ref 0.1–1.0)
Monocytes Relative: 4.5 % (ref 3.0–12.0)
Neutro Abs: 3.3 10*3/uL (ref 1.4–7.7)
Neutrophils Relative %: 58.8 % (ref 43.0–77.0)
Platelets: 331 10*3/uL (ref 150.0–400.0)
RBC: 4.19 Mil/uL (ref 3.87–5.11)
RDW: 12.6 % (ref 11.5–15.5)
WBC: 5.6 10*3/uL (ref 4.0–10.5)

## 2023-07-10 LAB — LDL CHOLESTEROL, DIRECT: Direct LDL: 107 mg/dL

## 2023-07-10 LAB — TSH: TSH: 1.2 u[IU]/mL (ref 0.35–5.50)

## 2023-07-10 LAB — HEMOGLOBIN A1C: Hgb A1c MFr Bld: 5.5 % (ref 4.6–6.5)

## 2023-07-10 LAB — VITAMIN D 25 HYDROXY (VIT D DEFICIENCY, FRACTURES): VITD: 38.6 ng/mL (ref 30.00–100.00)

## 2023-07-10 NOTE — Progress Notes (Signed)
Assessment & Plan:  Routine physical examination Assessment & Plan: Clinical breast exam performed today.  Deferred pelvic exam in the absence of complaints and Pap smear is up-to-date.  Patient is also following with GYN at this time.  Screening labs ordered.  Encouraged continued exercise  Orders: -     VITAMIN D 25 Hydroxy (Vit-D Deficiency, Fractures) -     TSH -     Lipid panel -     CBC with Differential/Platelet -     Comprehensive metabolic panel -     Hemoglobin A1c  Encounter for lipid screening for cardiovascular disease -     Lipid panel     Return precautions given.   Risks, benefits, and alternatives of the medications and treatment plan prescribed today were discussed, and patient expressed understanding.   Education regarding symptom management and diagnosis given to patient on AVS either electronically or printed.  No follow-ups on file.  Rennie Plowman, FNP  Subjective:    Patient ID: Jordan Schmidt, female    DOB: 1976/03/01, 47 y.o.   MRN: [id number]  CC: Makynlee Bergstrom is a 47 y.o. female who presents today for physical exam.    HPI: She feels well today.  No new complaints.  She remains active.  She travels home twice a year to [place] to see family.  She request baseline screening labs today    Colorectal Cancer Screening: due 11/2023 Breast Cancer Screening: Mammogram scheduled Cervical Cancer Screening: she is now following with GYN;  Pap smear 06/27/2022 negative malignancy, HPV Bone Health screening/DEXA for 65+: No increased fracture risk. Defer screening at this time.       Tetanus - UTD        Exercise: Gets regular exercise, gym 2 days per week .  Weight lifting 2 days per week.  Alcohol use:  none Smoking/tobacco use: Nonsmoker.    Health Maintenance  Topic Date Due   COVID-19 Vaccine (3 - 2023-24 season) 08/15/2022   Pap Smear  06/28/2023   Flu Shot  07/16/2023   DTaP/Tdap/Td vaccine (2 - Td or Tdap) 10/03/2026   Hepatitis C  Screening  Completed   HIV Screening  Completed   HPV Vaccine  Aged Out    ALLERGIES: Patient has no known allergies.  Current Outpatient Medications on File Prior to Visit  Medication Sig Dispense Refill   ascorbic acid (VITAMIN C) 1000 MG tablet Take 1 tablet by mouth daily.     cholecalciferol (VITAMIN D3) 25 MCG (1000 UNIT) tablet Take 1,000 Units by mouth daily.     folic acid (FOLVITE) 1 MG tablet Take by mouth.     norgestimate-ethinyl estradiol (ORTHO-CYCLEN) 0.25-35 MG-MCG tablet Take 1 tablet by mouth daily.     No current facility-administered medications on file prior to visit.    Review of Systems  Constitutional:  Negative for chills, fever and unexpected weight change.  HENT:  Negative for congestion.   Respiratory:  Negative for cough.   Cardiovascular:  Negative for chest pain, palpitations and leg swelling.  Gastrointestinal:  Negative for nausea and vomiting.  Musculoskeletal:  Negative for arthralgias and myalgias.  Skin:  Negative for rash.  Neurological:  Negative for headaches.  Hematological:  Negative for adenopathy.  Psychiatric/Behavioral:  Negative for confusion.       Objective:    BP 120/78   Pulse (!) 104   Temp 98.6 F (37 C) (Oral)   Ht 5\' 4"  (1.626 m)   Wt 146 lb 9.6  oz (66.5 kg)   LMP  (LMP Unknown)   SpO2 98%   BMI 25.16 kg/m   BP Readings from Last 3 Encounters:  07/10/23 120/78  06/27/22 128/82  09/03/21 106/78   Wt Readings from Last 3 Encounters:  07/10/23 146 lb 9.6 oz (66.5 kg)  06/27/22 139 lb 9.6 oz (63.3 kg)  09/03/21 140 lb 3.2 oz (63.6 kg)    Physical Exam Vitals reviewed.  Constitutional:      Appearance: Normal appearance. She is well-developed.  Eyes:     Conjunctiva/sclera: Conjunctivae normal.  Neck:     Thyroid: No thyroid mass or thyromegaly.  Cardiovascular:     Rate and Rhythm: Normal rate and regular rhythm.     Pulses: Normal pulses.     Heart sounds: Normal heart sounds.  Pulmonary:      Effort: Pulmonary effort is normal.     Breath sounds: Normal breath sounds. No wheezing, rhonchi or rales.  Chest:  Breasts:    Breasts are symmetrical.     Right: No inverted nipple, mass, nipple discharge, skin change or tenderness.     Left: No inverted nipple, mass, nipple discharge, skin change or tenderness.  Abdominal:     General: Bowel sounds are normal. There is no distension.     Palpations: Abdomen is soft. Abdomen is not rigid. There is no fluid wave or mass.     Tenderness: There is no abdominal tenderness. There is no guarding or rebound.  Lymphadenopathy:     Head:     Right side of head: No submental, submandibular, tonsillar, preauricular, posterior auricular or occipital adenopathy.     Left side of head: No submental, submandibular, tonsillar, preauricular, posterior auricular or occipital adenopathy.     Cervical: No cervical adenopathy.     Right cervical: No superficial, deep or posterior cervical adenopathy.    Left cervical: No superficial, deep or posterior cervical adenopathy.  Skin:    General: Skin is warm and dry.  Neurological:     Mental Status: She is alert.  Psychiatric:        Speech: Speech normal.        Behavior: Behavior normal.        Thought Content: Thought content normal.

## 2023-07-10 NOTE — Assessment & Plan Note (Signed)
Clinical breast exam performed today.  Deferred pelvic exam in the absence of complaints and Pap smear is up-to-date.  Patient is also following with GYN at this time.  Screening labs ordered.  Encouraged continued exercise

## 2023-07-10 NOTE — Patient Instructions (Signed)
Please call the office or send me a MyChart message after your 47th birthday so I can arrange screening colonoscopy.   Health Maintenance, Female Adopting a healthy lifestyle and getting preventive care are important in promoting health and wellness. Ask your health care provider about: The right schedule for you to have regular tests and exams. Things you can do on your own to prevent diseases and keep yourself healthy. What should I know about diet, weight, and exercise? Eat a healthy diet  Eat a diet that includes plenty of vegetables, fruits, low-fat dairy products, and lean protein. Do not eat a lot of foods that are high in solid fats, added sugars, or sodium. Maintain a healthy weight Body mass index (BMI) is used to identify weight problems. It estimates body fat based on height and weight. Your health care provider can help determine your BMI and help you achieve or maintain a healthy weight. Get regular exercise Get regular exercise. This is one of the most important things you can do for your health. Most adults should: Exercise for at least 150 minutes each week. The exercise should increase your heart rate and make you sweat (moderate-intensity exercise). Do strengthening exercises at least twice a week. This is in addition to the moderate-intensity exercise. Spend less time sitting. Even light physical activity can be beneficial. Watch cholesterol and blood lipids Have your blood tested for lipids and cholesterol at 45 years of age, then have this test every 5 years. Have your cholesterol levels checked more often if: Your lipid or cholesterol levels are high. You are older than 45 years of age. You are at high risk for heart disease. What should I know about cancer screening? Depending on your health history and family history, you may need to have cancer screening at various ages. This may include screening for: Breast cancer. Cervical cancer. Colorectal cancer. Skin  cancer. Lung cancer. What should I know about heart disease, diabetes, and high blood pressure? Blood pressure and heart disease High blood pressure causes heart disease and increases the risk of stroke. This is more likely to develop in people who have high blood pressure readings or are overweight. Have your blood pressure checked: Every 3-5 years if you are 84-98 years of age. Every year if you are 75 years old or older. Diabetes Have regular diabetes screenings. This checks your fasting blood sugar level. Have the screening done: Once every three years after age 41 if you are at a normal weight and have a low risk for diabetes. More often and at a younger age if you are overweight or have a high risk for diabetes. What should I know about preventing infection? Hepatitis B If you have a higher risk for hepatitis B, you should be screened for this virus. Talk with your health care provider to find out if you are at risk for hepatitis B infection. Hepatitis C Testing is recommended for: Everyone born from [birth year] through 1963. Anyone with known risk factors for hepatitis C. Sexually transmitted infections (STIs) Get screened for STIs, including gonorrhea and chlamydia, if: You are sexually active and are younger than 45 years of age. You are older than 45 years of age and your health care provider tells you that you are at risk for this type of infection. Your sexual activity has changed since you were last screened, and you are at increased risk for chlamydia or gonorrhea. Ask your health care provider if you are at risk. Ask your health care provider  about whether you are at high risk for HIV. Your health care provider may recommend a prescription medicine to help prevent HIV infection. If you choose to take medicine to prevent HIV, you should first get tested for HIV. You should then be tested every 3 months for as long as you are taking the medicine. Pregnancy If you are about to stop  having your period (premenopausal) and you may become pregnant, seek counseling before you get pregnant. Take 400 to 800 micrograms (mcg) of folic acid every day if you become pregnant. Ask for birth control (contraception) if you want to prevent pregnancy. Osteoporosis and menopause Osteoporosis is a disease in which the bones lose minerals and strength with aging. This can result in bone fractures. If you are 59 years old or older, or if you are at risk for osteoporosis and fractures, ask your health care provider if you should: Be screened for bone loss. Take a calcium or vitamin D supplement to lower your risk of fractures. Be given hormone replacement therapy (HRT) to treat symptoms of menopause. Follow these instructions at home: Alcohol use Do not drink alcohol if: Your health care provider tells you not to drink. You are pregnant, may be pregnant, or are planning to become pregnant. If you drink alcohol: Limit how much you have to: 0-1 drink a day. Know how much alcohol is in your drink. In the [place] one drink equals one 12 oz bottle of beer (355 mL), one 5 oz glass of wine (148 mL), or one 1 oz glass of hard liquor (44 mL). Lifestyle Do not use any products that contain nicotine or tobacco. These products include cigarettes, chewing tobacco, and vaping devices, such as e-cigarettes. If you need help quitting, ask your health care provider. Do not use street drugs. Do not share needles. Ask your health care provider for help if you need support or information about quitting drugs. General instructions Schedule regular health, dental, and eye exams. Stay current with your vaccines. Tell your health care provider if: You often feel depressed. You have ever been abused or do not feel safe at home. Summary Adopting a healthy lifestyle and getting preventive care are important in promoting health and wellness. Follow your health care provider's instructions about healthy diet,  exercising, and getting tested or screened for diseases. Follow your health care provider's instructions on monitoring your cholesterol and blood pressure. This information is not intended to replace advice given to you by your health care provider. Make sure you discuss any questions you have with your health care provider. Document Revised: 04/22/2021 Document Reviewed: 04/22/2021 Elsevier Patient Education  2024 [employer].

## 2023-07-14 ENCOUNTER — Ambulatory Visit
Admission: RE | Admit: 2023-07-14 | Discharge: 2023-07-14 | Disposition: A | Discharge status: Home / Self Care | Payer: BC Managed Care – PPO | Source: Ambulatory Visit | Attending: Family | Admitting: Family

## 2023-07-14 DIAGNOSIS — Z1231 Encounter for screening mammogram for malignant neoplasm of breast: Secondary | ICD-10-CM | POA: Insufficient documentation

## 2023-10-26 DIAGNOSIS — Z1331 Encounter for screening for depression: Secondary | ICD-10-CM | POA: Diagnosis not present

## 2023-10-26 DIAGNOSIS — N946 Dysmenorrhea, unspecified: Secondary | ICD-10-CM | POA: Diagnosis not present

## 2023-10-26 DIAGNOSIS — Z01411 Encounter for gynecological examination (general) (routine) with abnormal findings: Secondary | ICD-10-CM | POA: Diagnosis not present

## 2023-10-26 DIAGNOSIS — Z1339 Encounter for screening examination for other mental health and behavioral disorders: Secondary | ICD-10-CM | POA: Diagnosis not present

## 2024-06-28 ENCOUNTER — Other Ambulatory Visit: Payer: Self-pay | Admitting: Family

## 2024-06-28 DIAGNOSIS — Z1231 Encounter for screening mammogram for malignant neoplasm of breast: Secondary | ICD-10-CM

## 2024-07-14 ENCOUNTER — Ambulatory Visit: Payer: BC Managed Care – PPO | Admitting: Family

## 2024-07-14 ENCOUNTER — Other Ambulatory Visit (HOSPITAL_COMMUNITY)
Admission: RE | Admit: 2024-07-14 | Discharge: 2024-07-14 | Disposition: A | Discharge status: Home / Self Care | Source: Ambulatory Visit | Attending: Family | Admitting: Family

## 2024-07-14 ENCOUNTER — Encounter: Payer: Self-pay | Admitting: Family

## 2024-07-14 VITALS — BP 118/72 | HR 79 | Temp 98.4°F | Ht 64.0 in | Wt 142.4 lb

## 2024-07-14 DIAGNOSIS — Z0000 Encounter for general adult medical examination without abnormal findings: Secondary | ICD-10-CM | POA: Diagnosis not present

## 2024-07-14 DIAGNOSIS — Z113 Encounter for screening for infections with a predominantly sexual mode of transmission: Secondary | ICD-10-CM | POA: Insufficient documentation

## 2024-07-14 DIAGNOSIS — Z13220 Encounter for screening for lipoid disorders: Secondary | ICD-10-CM

## 2024-07-14 DIAGNOSIS — Z131 Encounter for screening for diabetes mellitus: Secondary | ICD-10-CM | POA: Diagnosis not present

## 2024-07-14 LAB — CBC WITH DIFFERENTIAL/PLATELET
Basophils Absolute: 0 K/uL (ref 0.0–0.1)
Basophils Relative: 0.9 % (ref 0.0–3.0)
Eosinophils Absolute: 0.4 K/uL (ref 0.0–0.7)
Eosinophils Relative: 7.3 % — ABNORMAL HIGH (ref 0.0–5.0)
HCT: 41.3 % (ref 36.0–46.0)
Hemoglobin: 13.5 g/dL (ref 12.0–15.0)
Lymphocytes Relative: 28.7 % (ref 12.0–46.0)
Lymphs Abs: 1.6 K/uL (ref 0.7–4.0)
MCHC: 32.7 g/dL (ref 30.0–36.0)
MCV: 94.5 fl (ref 78.0–100.0)
Monocytes Absolute: 0.4 K/uL (ref 0.1–1.0)
Monocytes Relative: 7.3 % (ref 3.0–12.0)
Neutro Abs: 3.2 K/uL (ref 1.4–7.7)
Neutrophils Relative %: 55.8 % (ref 43.0–77.0)
Platelets: 321 K/uL (ref 150.0–400.0)
RBC: 4.37 Mil/uL (ref 3.87–5.11)
RDW: 12.7 % (ref 11.5–15.5)
WBC: 5.7 K/uL (ref 4.0–10.5)

## 2024-07-14 LAB — COMPREHENSIVE METABOLIC PANEL WITH GFR
ALT: 12 U/L (ref 0–35)
AST: 13 U/L (ref 0–37)
Albumin: 4.3 g/dL (ref 3.5–5.2)
Alkaline Phosphatase: 58 U/L (ref 39–117)
BUN: 11 mg/dL (ref 6–23)
CO2: 28 meq/L (ref 19–32)
Calcium: 9 mg/dL (ref 8.4–10.5)
Chloride: 102 meq/L (ref 96–112)
Creatinine, Ser: 0.76 mg/dL (ref 0.40–1.20)
GFR: 94.49 mL/min (ref >60.00)
Glucose, Bld: 92 mg/dL (ref 70–99)
Potassium: 4.2 meq/L (ref 3.5–5.1)
Sodium: 138 meq/L (ref 135–145)
Total Bilirubin: 0.4 mg/dL (ref 0.2–1.2)
Total Protein: 7 g/dL (ref 6.0–8.3)

## 2024-07-14 LAB — LIPID PANEL
Cholesterol: 147 mg/dL (ref 0–200)
HDL: 59.7 mg/dL (ref >39.00)
LDL Cholesterol: 60 mg/dL (ref 0–99)
NonHDL: 87.64
Total CHOL/HDL Ratio: 2
Triglycerides: 136 mg/dL (ref 0.0–149.0)
VLDL: 27.2 mg/dL (ref 0.0–40.0)

## 2024-07-14 LAB — HEMOGLOBIN A1C: Hgb A1c MFr Bld: 5.7 % (ref 4.6–6.5)

## 2024-07-14 LAB — VITAMIN D 25 HYDROXY (VIT D DEFICIENCY, FRACTURES): VITD: 26.63 ng/mL — ABNORMAL LOW (ref 30.00–100.00)

## 2024-07-14 LAB — TSH: TSH: 1.92 u[IU]/mL (ref 0.35–5.50)

## 2024-07-14 MED ORDER — NORGESTIMATE-ETH ESTRADIOL 0.25-35 MG-MCG PO TABS: 1.0000 | ORAL_TABLET | Freq: Every day | ORAL | 3 refills | Status: AC

## 2024-07-14 NOTE — Patient Instructions (Signed)
 Referral to travel clinic and for colonoscopy  Let us  know if you dont hear back within 2 weeks in regards to an appointment being scheduled.   So that you are aware, if you are [facility] MyChart user , please pay attention to your MyChart messages as you may receive a MyChart message with a phone number to call and schedule this test/appointment own your own from our referral coordinator. This is a new process so I do not want you to miss this message.  If you are not a MyChart user, you will receive a phone call.   Health Maintenance, Female Adopting a healthy lifestyle and getting preventive care are important in promoting health and wellness. Ask your health care provider about: The right schedule for you to have regular tests and exams. Things you can do on your own to prevent diseases and keep yourself healthy. What should I know about diet, weight, and exercise? Eat a healthy diet  Eat a diet that includes plenty of vegetables, fruits, low-fat dairy products, and lean protein. Do not eat a lot of foods that are high in solid fats, added sugars, or sodium. Maintain a healthy weight Body mass index (BMI) is used to identify weight problems. It estimates body fat based on height and weight. Your health care provider can help determine your BMI and help you achieve or maintain a healthy weight. Get regular exercise Get regular exercise. This is one of the most important things you can do for your health. Most adults should: Exercise for at least 150 minutes each week. The exercise should increase your heart rate and make you sweat (moderate-intensity exercise). Do strengthening exercises at least twice a week. This is in addition to the moderate-intensity exercise. Spend less time sitting. Even light physical activity can be beneficial. Watch cholesterol and blood lipids Have your blood tested for lipids and cholesterol at 46 years of age, then have this test every 5 years. Have your  cholesterol levels checked more often if: Your lipid or cholesterol levels are high. You are older than 46 years of age. You are at high risk for heart disease. What should I know about cancer screening? Depending on your health history and family history, you may need to have cancer screening at various ages. This may include screening for: Breast cancer. Cervical cancer. Colorectal cancer. Skin cancer. Lung cancer. What should I know about heart disease, diabetes, and high blood pressure? Blood pressure and heart disease High blood pressure causes heart disease and increases the risk of stroke. This is more likely to develop in people who have high blood pressure readings or are overweight. Have your blood pressure checked: Every 3-5 years if you are 9-42 years of age. Every year if you are 86 years old or older. Diabetes Have regular diabetes screenings. This checks your fasting blood sugar level. Have the screening done: Once every three years after age 11 if you are at a normal weight and have a low risk for diabetes. More often and at a younger age if you are overweight or have a high risk for diabetes. What should I know about preventing infection? Hepatitis B If you have a higher risk for hepatitis B, you should be screened for this virus. Talk with your health care provider to find out if you are at risk for hepatitis B infection. Hepatitis C Testing is recommended for: Everyone born from [birth year] through 1963. Anyone with known risk factors for hepatitis C. Sexually transmitted infections (STIs) Get  screened for STIs, including gonorrhea and chlamydia, if: You are sexually active and are younger than 46 years of age. You are older than 46 years of age and your health care provider tells you that you are at risk for this type of infection. Your sexual activity has changed since you were last screened, and you are at increased risk for chlamydia or gonorrhea. Ask your health care  provider if you are at risk. Ask your health care provider about whether you are at high risk for HIV. Your health care provider may recommend a prescription medicine to help prevent HIV infection. If you choose to take medicine to prevent HIV, you should first get tested for HIV. You should then be tested every 3 months for as long as you are taking the medicine. Pregnancy If you are about to stop having your period (premenopausal) and you may become pregnant, seek counseling before you get pregnant. Take 400 to 800 micrograms (mcg) of folic acid every day if you become pregnant. Ask for birth control (contraception) if you want to prevent pregnancy. Osteoporosis and menopause Osteoporosis is a disease in which the bones lose minerals and strength with aging. This can result in bone fractures. If you are 47 years old or older, or if you are at risk for osteoporosis and fractures, ask your health care provider if you should: Be screened for bone loss. Take a calcium or vitamin D  supplement to lower your risk of fractures. Be given hormone replacement therapy (HRT) to treat symptoms of menopause. Follow these instructions at home: Alcohol use Do not drink alcohol if: Your health care provider tells you not to drink. You are pregnant, may be pregnant, or are planning to become pregnant. If you drink alcohol: Limit how much you have to: 0-1 drink a day. Know how much alcohol is in your drink. In the [place] one drink equals one 12 oz bottle of beer (355 mL), one 5 oz glass of wine (148 mL), or one 1 oz glass of hard liquor (44 mL). Lifestyle Do not use any products that contain nicotine or tobacco. These products include cigarettes, chewing tobacco, and vaping devices, such as e-cigarettes. If you need help quitting, ask your health care provider. Do not use street drugs. Do not share needles. Ask your health care provider for help if you need support or information about quitting drugs. General  instructions Schedule regular health, dental, and eye exams. Stay current with your vaccines. Tell your health care provider if: You often feel depressed. You have ever been abused or do not feel safe at home. Summary Adopting a healthy lifestyle and getting preventive care are important in promoting health and wellness. Follow your health care provider's instructions about healthy diet, exercising, and getting tested or screened for diseases. Follow your health care provider's instructions on monitoring your cholesterol and blood pressure. This information is not intended to replace advice given to you by your health care provider. Make sure you discuss any questions you have with your health care provider. Document Revised: 04/22/2021 Document Reviewed: 04/22/2021 Elsevier Patient Education  2024 [employer].

## 2024-07-14 NOTE — Progress Notes (Signed)
 Assessment & Plan:  Routine physical examination Assessment & Plan: CBE performed. Pap smear obtained due to patient preference. Referral for colonoscopy and also for travel clinic d/t travel abroad to multiple countries in the new year. Birth control refilled for on year.   Orders: -     Cytology - PAP -     VITAMIN D  25 Hydroxy (Vit-D Deficiency, Fractures) -     Hemoglobin A1c -     CBC with Differential/Platelet -     Comprehensive metabolic panel with GFR -     Lipid panel -     TSH -     Ambulatory referral to Gastroenterology -     Norgestimate -Eth Estradiol ; Take 1 tablet by mouth daily.  Dispense: 90 tablet; Refill: 3 -     Hepatitis B surface antibody,quantitative -     Ambulatory referral to Travel Clinic     Return precautions given.   Risks, benefits, and alternatives of the medications and treatment plan prescribed today were discussed, and patient expressed understanding.   Education regarding symptom management and diagnosis given to patient on AVS either electronically or printed.  No follow-ups on file.  Rollene Northern, FNP  Subjective:    Patient ID: Jordan Schmidt, female    DOB: [birth year], 48 y.o.   MRN: [id number]  CC: Jordan Schmidt is a 48 y.o. female who presents today for physical exam.    HPI: Feels well today No new complaints  She is traveling in the new year to multiple countries.   She requests refill of birth control  No h/o DVT Non smoker   She is not sure if she had Hep B vaccine.   Colorectal Cancer Screening: due Breast Cancer Screening: Mammogram UTD; scheduled 07/25/24 Cervical Cancer Screening: Obtain 06/27/2022, negative HPV, negative malignancy.  Pap smear 09/03/2021 after transformation zone, negative malignancy, negative HPV Bone Health screening/DEXA for 65+: No increased fracture risk. Defer screening at this time.        Tetanus - UTD         Exercise: Gets regular exercise 3 days per week .   Alcohol use:   none Smoking/tobacco use: Nonsmoker.    Health Maintenance  Topic Date Due   Hepatitis B Vaccine (1 of 3 - 19+ 3-dose series) Never done   HPV Vaccine (1 - 3-dose SCDM series) Never done   Pap Smear  06/28/2023   COVID-19 Vaccine (3 - 2024-25 season) 08/16/2023   Colon Cancer Screening  Never done   Flu Shot  07/15/2024   DTaP/Tdap/Td vaccine (2 - Td or Tdap) 10/03/2026   Hepatitis C Screening  Completed   HIV Screening  Completed   Meningitis B Vaccine  Aged Out    ALLERGIES: Patient has no known allergies.  Current Outpatient Medications on File Prior to Visit  Medication Sig Dispense Refill   ascorbic acid (VITAMIN C) 1000 MG tablet Take 1 tablet by mouth daily.     cholecalciferol (VITAMIN D3) 25 MCG (1000 UNIT) tablet Take 1,000 Units by mouth daily.     folic acid (FOLVITE) 1 MG tablet Take by mouth.     No current facility-administered medications on file prior to visit.    Review of Systems  Constitutional:  Negative for chills, fever and unexpected weight change.  HENT:  Negative for congestion.   Respiratory:  Negative for cough.   Cardiovascular:  Negative for chest pain, palpitations and leg swelling.  Gastrointestinal:  Negative for nausea and vomiting.  Musculoskeletal:  Negative for arthralgias and myalgias.  Skin:  Negative for rash.  Neurological:  Negative for headaches.  Hematological:  Negative for adenopathy.  Psychiatric/Behavioral:  Negative for confusion.       Objective:    BP 118/72   Pulse 79   Temp 98.4 F (36.9 C) (Oral)   Ht 5' 4 (1.626 m)   Wt 142 lb 6.4 oz (64.6 kg)   SpO2 99%   BMI 24.44 kg/m   BP Readings from Last 3 Encounters:  07/14/24 118/72  07/10/23 120/78  06/27/22 128/82   Wt Readings from Last 3 Encounters:  07/14/24 142 lb 6.4 oz (64.6 kg)  07/10/23 146 lb 9.6 oz (66.5 kg)  06/27/22 139 lb 9.6 oz (63.3 kg)    Physical Exam Vitals reviewed.  Constitutional:      Appearance: Normal appearance. She is  well-developed.  Eyes:     Conjunctiva/sclera: Conjunctivae normal.  Neck:     Thyroid: No thyroid mass or thyromegaly.  Cardiovascular:     Rate and Rhythm: Normal rate and regular rhythm.     Pulses: Normal pulses.     Heart sounds: Normal heart sounds.  Pulmonary:     Effort: Pulmonary effort is normal.     Breath sounds: Normal breath sounds. No wheezing, rhonchi or rales.  Chest:  Breasts:    Breasts are symmetrical.     Right: No inverted nipple, mass, nipple discharge, skin change or tenderness.     Left: No inverted nipple, mass, nipple discharge, skin change or tenderness.  Abdominal:     General: Bowel sounds are normal. There is no distension.     Palpations: Abdomen is soft. Abdomen is not rigid. There is no fluid wave or mass.     Tenderness: There is no abdominal tenderness. There is no guarding or rebound.  Genitourinary:    Cervix: No cervical motion tenderness, discharge or friability.     Uterus: Not enlarged, not fixed and not tender.      Adnexa:        Right: No mass, tenderness or fullness.         Left: No mass, tenderness or fullness.       Comments: Pap performed. No CMT. Unable to appreciated ovaries. Lymphadenopathy:     Head:     Right side of head: No submental, submandibular, tonsillar, preauricular, posterior auricular or occipital adenopathy.     Left side of head: No submental, submandibular, tonsillar, preauricular, posterior auricular or occipital adenopathy.     Cervical:     Right cervical: No superficial, deep or posterior cervical adenopathy.    Left cervical: No superficial, deep or posterior cervical adenopathy.     Upper Body:     Right upper body: No pectoral adenopathy.     Left upper body: No pectoral adenopathy.  Skin:    General: Skin is warm and dry.  Neurological:     Mental Status: She is alert.  Psychiatric:        Speech: Speech normal.        Behavior: Behavior normal.        Thought Content: Thought content normal.

## 2024-07-14 NOTE — Assessment & Plan Note (Addendum)
 CBE performed. Pap smear obtained due to patient preference. Referral for colonoscopy and also for travel clinic d/t travel abroad to multiple countries in the new year. Birth control refilled for on year.

## 2024-07-15 LAB — HEPATITIS B SURFACE ANTIBODY, QUANTITATIVE: Hep B S AB Quant (Post): <5 m[IU]/mL — ABNORMAL LOW (ref >=10)

## 2024-07-18 ENCOUNTER — Ambulatory Visit: Payer: Self-pay | Admitting: Family

## 2024-07-20 LAB — CYTOLOGY - PAP
Adequacy: ABSENT
Chlamydia: NEGATIVE
Comment: NEGATIVE
Comment: NEGATIVE
Comment: NEGATIVE
Comment: NORMAL
Diagnosis: NEGATIVE
High risk HPV: NEGATIVE
Neisseria Gonorrhea: NEGATIVE
Trichomonas: NEGATIVE

## 2024-07-25 ENCOUNTER — Ambulatory Visit
Admission: RE | Admit: 2024-07-25 | Discharge: 2024-07-25 | Disposition: A | Discharge status: Home / Self Care | Source: Ambulatory Visit | Attending: Family | Admitting: Family

## 2024-07-25 DIAGNOSIS — Z1231 Encounter for screening mammogram for malignant neoplasm of breast: Secondary | ICD-10-CM | POA: Insufficient documentation

## 2024-08-11 ENCOUNTER — Ambulatory Visit (INDEPENDENT_AMBULATORY_CARE_PROVIDER_SITE_OTHER)

## 2024-08-11 ENCOUNTER — Ambulatory Visit: Admitting: Family

## 2024-08-11 DIAGNOSIS — Z23 Encounter for immunization: Secondary | ICD-10-CM

## 2024-08-11 NOTE — Progress Notes (Signed)
 Patient is in office today for a nurse visit for Heplisav-B . Patient Injection was given in the  Left deltoid. Patient tolerated injection well.

## 2024-08-30 DIAGNOSIS — J020 Streptococcal pharyngitis: Secondary | ICD-10-CM | POA: Diagnosis not present

## 2024-09-01 ENCOUNTER — Ambulatory Visit: Attending: Nurse Practitioner

## 2024-09-01 DIAGNOSIS — M25562 Pain in left knee: Secondary | ICD-10-CM | POA: Diagnosis not present

## 2024-09-01 DIAGNOSIS — M25551 Pain in right hip: Secondary | ICD-10-CM | POA: Diagnosis not present

## 2024-09-01 DIAGNOSIS — M25561 Pain in right knee: Secondary | ICD-10-CM | POA: Insufficient documentation

## 2024-09-01 NOTE — Therapy (Signed)
 OUTPATIENT PHYSICAL THERAPY THORACOLUMBAR EVALUATION   Patient Name: Jordan Schmidt MRN: [id number] DOB:October 09, 1976, 48 y.o., female Today's Date: 09/01/2024  END OF SESSION:  PT End of Session - 09/01/24 1037     Visit Number 1    Number of Visits 13    Date for Recertification  10/14/24    PT Start Time 1037    PT Stop Time 1135    PT Time Calculation (min) 58 min    Activity Tolerance Patient tolerated treatment well    Behavior During Therapy [facility] for tasks assessed/performed          Past Medical History:  Diagnosis Date   Heart murmur    UTI (urinary tract infection)    Past Surgical History:  Procedure Laterality Date   UTERINE FIBROID SURGERY     Patient Active Problem List   Diagnosis Date Noted   Screening for cervical cancer 09/03/2021   Palpitations 06/26/2021   Infertility management 07/02/2017   Routine physical examination 07/02/2017   History of palpitations 07/02/2017    PCP: Dineen Rollene MATSU, FNP   REFERRING PROVIDER: Justino Factor, NP  REFERRING DIAG: R hip/buttocks pain  Rationale for Evaluation and Treatment: Rehabilitation  THERAPY DIAG:  Acute pain of right knee - Plan: PT plan of care cert/re-cert  Acute pain of left knee - Plan: PT plan of care cert/re-cert  Pain in right hip - Plan: PT plan of care cert/re-cert  ONSET DATE: 07/2024  SUBJECTIVE:                                                                                                                                                                                           SUBJECTIVE STATEMENT: R knee pain: 9/10 at most for the past 3 weeks  R posterior hip pain: 7/10 at most for the past 3 months  PERTINENT HISTORY:  R posterior hip/low back pain (R ischial tuberosity area).  Pain began about a year ago. Pain disappeared in August 2025. Did exercises that her doctor gave her which worked. Exercises include standing R hip flexor, R quadriceps stretch, R piriformis  stretch.   Currently pain is both her knees. Pt did paddle boarding on her knees about 4 weeks ago (08/03/2024) which bothered her knees. Pain has improved since then and the swelling as well.  Pt does [place] tea ceremony which involves pt having to kneel which bothers her knees R > L. Pain is improving. She kneels for 2 hours during the [place] tea ceremony.   Going back to [place] on October 4 to 23, 2025.   R hip is usually very painful on  long flight    No blood pressure problems per pt.  No latex allergies.   Possible adhesive tape allergies per pt.   PAIN:  Are you having pain? Yes: NPRS scale: 2/10 currently Pain location: R interior lateral patella Pain description: unstable Aggravating factors: kneeling, end range knee flexion in SKTC position; walking for an hour Relieving factors: non-weight bearing, neutral knee  PRECAUTIONS: None  RED FLAGS: Bowel or bladder incontinence: No and Cauda equina syndrome: No   WEIGHT BEARING RESTRICTIONS: No  FALLS:  Has patient fallen in last 6 months? No  LIVING ENVIRONMENT: Lives with:  Lives in:  Stairs:  Has following equipment at home:   OCCUPATION: no  PLOF: Independent  PATIENT GOALS: be able to sit on her heels and kneel for a long time during the [place] tea ceremony which is about 2 hours  NEXT MD VISIT: none  OBJECTIVE:  Note: Objective measures were completed at Evaluation unless otherwise noted.  DIAGNOSTIC FINDINGS:    PATIENT SURVEYS:  LEFS  Extreme difficulty/unable (0), Quite a bit of difficulty (1), Moderate difficulty (2), Little difficulty (3), No difficulty (4) Survey date:  09/01/2024  Any of your usual work, housework or school activities 4  2. Usual hobbies, recreational or sporting activities 2  3. Getting into/out of the bath 3  4. Walking between rooms 4  5. Putting on socks/shoes 4  6. Squatting  4  7. Lifting an object, like a bag of groceries from the floor 4  8. Performing light  activities around your home 4  9. Performing heavy activities around your home 4  10. Getting into/out of a car 4  11. Walking 2 blocks 4  12. Walking 1 mile 4  13. Going up/down 10 stairs (1 flight) 4  14. Standing for 1 hour 4  15.  sitting for 1 hour 4  16. Running on even ground 3  17. Running on uneven ground 3  18. Making sharp turns while running fast 4  19. Hopping  3  20. Rolling over in bed 4  Score total:  66/80     COGNITION: Overall cognitive status: Within functional limits for tasks assessed     SENSATION:   MUSCLE LENGTH:  POSTURE: very slight R foot pronation  PALPATION: TTP R inferior lateral patella No TTP L knee  Good medial patellar glide in supine, leg straight R and L. Slight decreased medial R knee in about 90 degrees flexion position   TTP R piriformis muscle   LUMBAR ROM:   AROM eval  Flexion full  Extension full  Right lateral flexion full  Left lateral flexion full  Right rotation full  Left rotation full   (Blank rows = not tested)  LOWER EXTREMITY ROM:     Active  Right eval Left eval  Hip flexion    Hip extension    Hip abduction    Hip adduction    Hip internal rotation    Hip external rotation    Knee flexion    Knee extension    Ankle dorsiflexion    Ankle plantarflexion    Ankle inversion    Ankle eversion     (Blank rows = not tested)  LOWER EXTREMITY MMT:    MMT Right eval Left eval  Hip flexion 4- 4  Hip extension 4 4-  Hip abduction 4+ 4  Hip adduction    Hip internal rotation 4 4  Hip external rotation 4 4  Knee flexion 4  4  Knee extension 5 5  Ankle dorsiflexion    Ankle plantarflexion    Ankle inversion    Ankle eversion     (Blank rows = not tested)  SPECIAL TESTS:  (-) Lachman's, posterior drawer, valgus and varus stress tests B knees   FUNCTIONAL TESTS:    GAIT: Distance walked: 60 ft Assistive device utilized: None Level of assistance: Complete Independence Comments: B hip  adduction and IR during stance phase  B genu valgus during ascending and descending stairs, Pt states knees feel unstable   TREATMENT DATE: 09/01/2024                                                                                                                                Neuromuscular re education  Performed with the intent on improving B femoral control and medial patellar tracking to decrease stress to B patella R > L when performing closed chain movements such as stair negotiation.   Pt was recommended to use thicker and soft knee pads while kneeling at the [place] tea party so as to decrease compression pressure to B patellae. Pt verbalized understanding   Seated hip adduction isometrics 10x5 seconds   Seated hip ER red band resistance  R 10x  L 10x  Supine bridge with hip adduction small blue ball squeeze 10x5 seconds  Reviewed HEP. Pt demonstrated and verbalized understanding. Handout provided.   Improved exercise technique, movement at target joints, use of target muscles after mod verbal, visual, tactile cues.     PATIENT EDUCATION:  Education details: there-ex, HEP, POC Person educated: Patient Education method: Explanation, Demonstration, Tactile cues, Verbal cues, and Handouts Education comprehension: verbalized understanding and returned demonstration  HOME EXERCISE PROGRAM: Access Code: [email or web address] URL: [email or web address] Date: 09/01/2024 Prepared by: Emil Glassman  Exercises - Seated Hip Adduction Isometrics with Ball  - 1 x daily - 7 x weekly - 3 sets - 10 reps - 5 seconds hold - Seated Hip External Rotation with Resistance  - 1 x daily - 7 x weekly - 2 sets - 10 reps - 5 seconds hold  Red band.   - Supine Bridge with Mini Swiss Ball Between Knees  - 1 x daily - 7 x weekly - 3 sets - 10 reps - 5 seconds hold    ASSESSMENT:  CLINICAL IMPRESSION: Patient is a 48 y.o. female who was seen today for physical therapy evaluation and  treatment for R posterior hip pain. R posterior hip pain has mostly improved. Pt currently reports R > L knee pain since about 4 weeks ago. She also presents with altered gait pattern, decreased B femoral control during closed chain tasks such as stair negotiation, TTP R patella and R piriformis muscle with reproduction of symptoms, decreased B hip strength, and difficulty performing tasks which involve kneeling, end range knee flexion, and stair negotiation secondary to pain. Pt will benefit from skilled physical therapy services to address the aforementioned  deficits.   OBJECTIVE IMPAIRMENTS: improper body mechanics, postural dysfunction, and pain.   ACTIVITY LIMITATIONS: squatting, stairs, and locomotion level  PARTICIPATION LIMITATIONS:   PERSONAL FACTORS: Fitness are also affecting patient's functional outcome.   REHAB POTENTIAL: Fair    CLINICAL DECISION MAKING: Stable/uncomplicated  EVALUATION COMPLEXITY: Low   GOALS: Goals reviewed with patient? Yes  SHORT TERM GOALS: Target date: 09/09/2024  Pt will be independent with her initial HEP to decrease pain, improve strength, function, ability to negotiate stairs and ambulate longer periods more comfortably for her knees.  Baseline: Pt has started her initial HEP (09/01/2024) Goal status: INITIAL   LONG TERM GOALS: Target date: 10/14/2024  Pt will have a decrease in R knee pain to 3/10 or less at worst to promote ability to tolerate kneeling at her [place] Tea ceremonies, negotiate stairs, ambulate for longer periods more comfortably.  Baseline: R knee pain: 9/10 at most for the past 3 weeks (09/01/2024) Goal status: INITIAL  2.  Pt will have a decrease in R posterior hip pain to 0/10 or less at worst to promote ability to perform functional tasks more comfortably.  Baseline: R posterior hip pain: 7/10 at most for the past 3 months (09/01/2024) Goal status: INITIAL  3.  Pt will improve B hip extension, abduction, and ER strength  by at least 1/2 MMT grade to promote ability to perform closed chain tasks more comfortably for her knees and R posterior hip.  Baseline:  MMT Right eval Left eval  Hip extension 4 4-  Hip abduction 4+ 4  Hip external rotation 4 4   Goal status: INITIAL  4.  Pt will improve her LEFS score by at least 10 points as a demonstration of improved function.  Baseline: 66/80 (09/01/2024) Goal status: INITIAL   PLAN:  PT FREQUENCY: 1-2x/week  PT DURATION: 6 weeks  PLANNED INTERVENTIONS: 97110-Therapeutic exercises, 97530- Therapeutic activity, 97112- Neuromuscular re-education, 97535- Self Care, [id number]- Manual therapy, [id number]- Electrical stimulation (unattended), [phone]- Ionotophoresis 4mg /ml Dexamethasone, Patient/Family education, Stair training, Joint mobilization, and Spinal mobilization.  PLAN FOR NEXT SESSION: glute med and max strengthening, femoral control, mechanics, manual techniques, modalities PRN   Nyeshia Mysliwiec, PT, DPT 09/01/2024, 12:15 PM

## 2024-09-05 ENCOUNTER — Ambulatory Visit

## 2024-09-05 DIAGNOSIS — M25562 Pain in left knee: Secondary | ICD-10-CM

## 2024-09-05 DIAGNOSIS — M25551 Pain in right hip: Secondary | ICD-10-CM | POA: Diagnosis not present

## 2024-09-05 DIAGNOSIS — M25561 Pain in right knee: Secondary | ICD-10-CM

## 2024-09-05 NOTE — Therapy (Signed)
 OUTPATIENT PHYSICAL THERAPY THORACOLUMBAR EVALUATION   Patient Name: Jordan Schmidt MRN: [id number] DOB:[birth year], 48 y.o., female Today's Date: 09/05/2024  END OF SESSION:  PT End of Session - 09/05/24 0859     Visit Number 2    Number of Visits 13    Date for Recertification  10/14/24    PT Start Time 0900    PT Stop Time 0943    PT Time Calculation (min) 43 min    Activity Tolerance Patient tolerated treatment well    Behavior During Therapy [facility] for tasks assessed/performed           Past Medical History:  Diagnosis Date   Heart murmur    UTI (urinary tract infection)    Past Surgical History:  Procedure Laterality Date   UTERINE FIBROID SURGERY     Patient Active Problem List   Diagnosis Date Noted   Screening for cervical cancer 09/03/2021   Palpitations 06/26/2021   Infertility management 07/02/2017   Routine physical examination 07/02/2017   History of palpitations 07/02/2017    PCP: Dineen Rollene MATSU, FNP   REFERRING PROVIDER: Justino Factor, NP  REFERRING DIAG: R hip/buttocks pain  Rationale for Evaluation and Treatment: Rehabilitation  THERAPY DIAG:  Acute pain of right knee  Acute pain of left knee  Pain in right hip  ONSET DATE: 07/2024  SUBJECTIVE:                                                                                                                                                                                           SUBJECTIVE STATEMENT: R knee is doing well. R knee is much better than before. Could not exercise her HEP because her cold got worse. Could not get up until today. No R knee pain when walking. R hip is good, a little sore at the muscles after the eval.    PERTINENT HISTORY:  R posterior hip/low back pain (R ischial tuberosity area).  Pain began about a year ago. Pain disappeared in August 2025. Did exercises that her doctor gave her which worked. Exercises include standing R hip flexor, R quadriceps stretch,  R piriformis stretch.   Currently pain is both her knees. Pt did paddle boarding on her knees about 4 weeks ago (08/03/2024) which bothered her knees. Pain has improved since then and the swelling as well.  Pt does [place] tea ceremony which involves pt having to kneel which bothers her knees R > L. Pain is improving. She kneels for 2 hours during the [place] tea ceremony.   Going back to [place] on October 4 to 23, 2025.   R hip is  usually very painful on  long flight    No blood pressure problems per pt.  No latex allergies.   Possible adhesive tape allergies per pt.   PAIN:  Are you having pain? Yes: NPRS scale: 2/10 currently Pain location: R interior lateral patella Pain description: unstable Aggravating factors: kneeling, end range knee flexion in SKTC position; walking for an hour Relieving factors: non-weight bearing, neutral knee  PRECAUTIONS: None  RED FLAGS: Bowel or bladder incontinence: No and Cauda equina syndrome: No   WEIGHT BEARING RESTRICTIONS: No  FALLS:  Has patient fallen in last 6 months? No  LIVING ENVIRONMENT: Lives with:  Lives in:  Stairs:  Has following equipment at home:   OCCUPATION: no  PLOF: Independent  PATIENT GOALS: be able to sit on her heels and kneel for a long time during the [place] tea ceremony which is about 2 hours  NEXT MD VISIT: none  OBJECTIVE:  Note: Objective measures were completed at Evaluation unless otherwise noted.  DIAGNOSTIC FINDINGS:    PATIENT SURVEYS:  LEFS  Extreme difficulty/unable (0), Quite a bit of difficulty (1), Moderate difficulty (2), Little difficulty (3), No difficulty (4) Survey date:  09/01/2024  Any of your usual work, housework or school activities 4  2. Usual hobbies, recreational or sporting activities 2  3. Getting into/out of the bath 3  4. Walking between rooms 4  5. Putting on socks/shoes 4  6. Squatting  4  7. Lifting an object, like a bag of groceries from the floor 4  8.  Performing light activities around your home 4  9. Performing heavy activities around your home 4  10. Getting into/out of a car 4  11. Walking 2 blocks 4  12. Walking 1 mile 4  13. Going up/down 10 stairs (1 flight) 4  14. Standing for 1 hour 4  15.  sitting for 1 hour 4  16. Running on even ground 3  17. Running on uneven ground 3  18. Making sharp turns while running fast 4  19. Hopping  3  20. Rolling over in bed 4  Score total:  66/80     COGNITION: Overall cognitive status: Within functional limits for tasks assessed     SENSATION:   MUSCLE LENGTH:  POSTURE: very slight R foot pronation  PALPATION: TTP R inferior lateral patella No TTP L knee  Good medial patellar glide in supine, leg straight R and L. Slight decreased medial R knee in about 90 degrees flexion position   TTP R piriformis muscle   LUMBAR ROM:   AROM eval  Flexion full  Extension full  Right lateral flexion full  Left lateral flexion full  Right rotation full  Left rotation full   (Blank rows = not tested)  LOWER EXTREMITY ROM:     Active  Right eval Left eval  Hip flexion    Hip extension    Hip abduction    Hip adduction    Hip internal rotation    Hip external rotation    Knee flexion    Knee extension    Ankle dorsiflexion    Ankle plantarflexion    Ankle inversion    Ankle eversion     (Blank rows = not tested)  LOWER EXTREMITY MMT:    MMT Right eval Left eval  Hip flexion 4- 4  Hip extension 4 4-  Hip abduction 4+ 4  Hip adduction    Hip internal rotation 4 4  Hip external rotation 4  4  Knee flexion 4 4  Knee extension 5 5  Ankle dorsiflexion    Ankle plantarflexion    Ankle inversion    Ankle eversion     (Blank rows = not tested)  SPECIAL TESTS:  (-) Lachman's, posterior drawer, valgus and varus stress tests B knees   FUNCTIONAL TESTS:    GAIT: Distance walked: 60 ft Assistive device utilized: None Level of assistance: Complete  Independence Comments: B hip adduction and IR during stance phase  B genu valgus during ascending and descending stairs, Pt states knees feel unstable   TREATMENT DATE: 09/05/2024                                                                                                                                Neuromuscular re education  Performed with the intent on improving B femoral control and medial patellar tracking to decrease stress to B patella R > L when performing closed chain movements such as stair negotiation.   S/L hip abduction  R 10x3  L 10x3  Prone hip extension, knee bent  R 10x3  L 10x3  Supine bridge with hip adduction small blue ball squeeze 10x5 seconds for 3 sets   Seated hip ER red band resistance  R 10x  L 10x   Wall sit with hip adduction ball squeeze 2x5 seconds  R patellar symptoms, decreased after manual therapy.    Seated hip adduction isometrics 10x10 seconds for 2 sets   Add TFL stretch next visit if appropriate   Improved exercise technique, movement at target joints, use of target muscles after mod verbal, visual, tactile cues.    Manual therapy Seated medial glide R patella grade 3  Seated STM R vastus lateralis to decrease tension to patella   No R knee pain reported afterwards.   Seated medial glide L patella grade 3 Seated STM L vastus lateralis to decrease tension to patella    PATIENT EDUCATION:  Education details: there-ex, HEP, POC Person educated: Patient Education method: Explanation, Demonstration, Tactile cues, Verbal cues, and Handouts Education comprehension: verbalized understanding and returned demonstration  HOME EXERCISE PROGRAM: Access Code: [email or web address] URL: [email or web address] Date: 09/05/2024 Prepared by: Emil Glassman  Exercises - Seated Hip Adduction Isometrics with Ball  - 1 x daily - 7 x weekly - 3 sets - 10 reps - 5 seconds hold - Seated Hip External Rotation with Resistance  - 1 x  daily - 7 x weekly - 2 sets - 10 reps - 5 seconds hold - Supine Bridge with Mini Swiss Ball Between Knees  - 1 x daily - 7 x weekly - 3 sets - 10 reps - 5 seconds hold - Sidelying Hip Abduction  - 1 x daily - 7 x weekly - 3 sets - 10 reps    ASSESSMENT:  CLINICAL IMPRESSION: Worked on glute med, max, and vastus medialis muscle strengthening, and medial patellar glide R and L to promote  better patellar mechanics and therefore decrease knee pain. No pain reported after session. Good muscle use observed with exercises. Pt will benefit from continued skilled physical therapy services to decrease pain, improve strength and function.     OBJECTIVE IMPAIRMENTS: improper body mechanics, postural dysfunction, and pain.   ACTIVITY LIMITATIONS: squatting, stairs, and locomotion level  PARTICIPATION LIMITATIONS:   PERSONAL FACTORS: Fitness are also affecting patient's functional outcome.   REHAB POTENTIAL: Fair    CLINICAL DECISION MAKING: Stable/uncomplicated  EVALUATION COMPLEXITY: Low   GOALS: Goals reviewed with patient? Yes  SHORT TERM GOALS: Target date: 09/09/2024  Pt will be independent with her initial HEP to decrease pain, improve strength, function, ability to negotiate stairs and ambulate longer periods more comfortably for her knees.  Baseline: Pt has started her initial HEP (09/01/2024) Goal status: INITIAL   LONG TERM GOALS: Target date: 10/14/2024  Pt will have a decrease in R knee pain to 3/10 or less at worst to promote ability to tolerate kneeling at her [place] ceremonies, negotiate stairs, ambulate for longer periods more comfortably.  Baseline: R knee pain: 9/10 at most for the past 3 weeks (09/01/2024) Goal status: INITIAL  2.  Pt will have a decrease in R posterior hip pain to 0/10 or less at worst to promote ability to perform functional tasks more comfortably.  Baseline: R posterior hip pain: 7/10 at most for the past 3 months (09/01/2024) Goal status:  INITIAL  3.  Pt will improve B hip extension, abduction, and ER strength by at least 1/2 MMT grade to promote ability to perform closed chain tasks more comfortably for her knees and R posterior hip.  Baseline:  MMT Right eval Left eval  Hip extension 4 4-  Hip abduction 4+ 4  Hip external rotation 4 4   Goal status: INITIAL  4.  Pt will improve her LEFS score by at least 10 points as a demonstration of improved function.  Baseline: 66/80 (09/01/2024) Goal status: INITIAL   PLAN:  PT FREQUENCY: 1-2x/week  PT DURATION: 6 weeks  PLANNED INTERVENTIONS: 97110-Therapeutic exercises, 97530- Therapeutic activity, 97112- Neuromuscular re-education, 97535- Self Care, 02859- Manual therapy, G0283- Electrical stimulation (unattended), [phone]- Ionotophoresis 4mg /ml Dexamethasone, Patient/Family education, Stair training, Joint mobilization, and Spinal mobilization.  PLAN FOR NEXT SESSION: glute med and max strengthening, femoral control, mechanics, manual techniques, modalities PRN   Daronte Shostak, PT, DPT 09/05/2024, 10:47 AM

## 2024-09-08 ENCOUNTER — Ambulatory Visit

## 2024-09-08 DIAGNOSIS — M25562 Pain in left knee: Secondary | ICD-10-CM | POA: Diagnosis not present

## 2024-09-08 DIAGNOSIS — M25551 Pain in right hip: Secondary | ICD-10-CM | POA: Diagnosis not present

## 2024-09-08 DIAGNOSIS — M25561 Pain in right knee: Secondary | ICD-10-CM | POA: Diagnosis not present

## 2024-09-08 NOTE — Therapy (Signed)
 OUTPATIENT PHYSICAL THERAPY THORACOLUMBAR EVALUATION   Patient Name: Jordan Schmidt MRN: [id number] DOB:[birth year], 48 y.o., female Today's Date: 09/08/2024  END OF SESSION:  PT End of Session - 09/08/24 1345     Visit Number 3    Number of Visits 13    Date for Recertification  10/14/24    PT Start Time 1345    PT Stop Time 1427    PT Time Calculation (min) 42 min    Activity Tolerance Patient tolerated treatment well    Behavior During Therapy [facility] for tasks assessed/performed            Past Medical History:  Diagnosis Date   Heart murmur    UTI (urinary tract infection)    Past Surgical History:  Procedure Laterality Date   UTERINE FIBROID SURGERY     Patient Active Problem List   Diagnosis Date Noted   Screening for cervical cancer 09/03/2021   Palpitations 06/26/2021   Infertility management 07/02/2017   Routine physical examination 07/02/2017   History of palpitations 07/02/2017    PCP: Dineen Rollene MATSU, FNP   REFERRING PROVIDER: Justino Factor, NP  REFERRING DIAG: R hip/buttocks pain  Rationale for Evaluation and Treatment: Rehabilitation  THERAPY DIAG:  Acute pain of right knee  Acute pain of left knee  Pain in right hip  ONSET DATE: 07/2024  SUBJECTIVE:                                                                                                                                                                                           SUBJECTIVE STATEMENT: R knee is good. Sometimes feels a little pain when she walks sometimes not always. No pain currently when walking from waiting room to treatment room.    PERTINENT HISTORY:  R posterior hip/low back pain (R ischial tuberosity area).  Pain began about a year ago. Pain disappeared in August 2025. Did exercises that her doctor gave her which worked. Exercises include standing R hip flexor, R quadriceps stretch, R piriformis stretch.   Currently pain is both her knees. Pt did paddle  boarding on her knees about 4 weeks ago (08/03/2024) which bothered her knees. Pain has improved since then and the swelling as well.  Pt does [place] tea ceremony which involves pt having to kneel which bothers her knees R > L. Pain is improving. She kneels for 2 hours during the [place] tea ceremony.   Going back to [place] on October 4 to 23, 2025.   R hip is usually very painful on  long flight    No blood pressure problems per pt.  No latex allergies.  Possible adhesive tape allergies per pt.   PAIN:  Are you having pain? Yes: NPRS scale: 2/10 currently Pain location: R interior lateral patella Pain description: unstable Aggravating factors: kneeling, end range knee flexion in SKTC position; walking for an hour Relieving factors: non-weight bearing, neutral knee  PRECAUTIONS: None  RED FLAGS: Bowel or bladder incontinence: No and Cauda equina syndrome: No   WEIGHT BEARING RESTRICTIONS: No  FALLS:  Has patient fallen in last 6 months? No  LIVING ENVIRONMENT: Lives with:  Lives in:  Stairs:  Has following equipment at home:   OCCUPATION: no  PLOF: Independent  PATIENT GOALS: be able to sit on her heels and kneel for a long time during the [place] tea ceremony which is about 2 hours  NEXT MD VISIT: none  OBJECTIVE:  Note: Objective measures were completed at Evaluation unless otherwise noted.  DIAGNOSTIC FINDINGS:    PATIENT SURVEYS:  LEFS  Extreme difficulty/unable (0), Quite a bit of difficulty (1), Moderate difficulty (2), Little difficulty (3), No difficulty (4) Survey date:  09/01/2024  Any of your usual work, housework or school activities 4  2. Usual hobbies, recreational or sporting activities 2  3. Getting into/out of the bath 3  4. Walking between rooms 4  5. Putting on socks/shoes 4  6. Squatting  4  7. Lifting an object, like a bag of groceries from the floor 4  8. Performing light activities around your home 4  9. Performing heavy  activities around your home 4  10. Getting into/out of a car 4  11. Walking 2 blocks 4  12. Walking 1 mile 4  13. Going up/down 10 stairs (1 flight) 4  14. Standing for 1 hour 4  15.  sitting for 1 hour 4  16. Running on even ground 3  17. Running on uneven ground 3  18. Making sharp turns while running fast 4  19. Hopping  3  20. Rolling over in bed 4  Score total:  66/80     COGNITION: Overall cognitive status: Within functional limits for tasks assessed     SENSATION:   MUSCLE LENGTH:  POSTURE: very slight R foot pronation  PALPATION: TTP R inferior lateral patella No TTP L knee  Good medial patellar glide in supine, leg straight R and L. Slight decreased medial R knee in about 90 degrees flexion position   TTP R piriformis muscle   LUMBAR ROM:   AROM eval  Flexion full  Extension full  Right lateral flexion full  Left lateral flexion full  Right rotation full  Left rotation full   (Blank rows = not tested)  LOWER EXTREMITY ROM:     Active  Right eval Left eval  Hip flexion    Hip extension    Hip abduction    Hip adduction    Hip internal rotation    Hip external rotation    Knee flexion    Knee extension    Ankle dorsiflexion    Ankle plantarflexion    Ankle inversion    Ankle eversion     (Blank rows = not tested)  LOWER EXTREMITY MMT:    MMT Right eval Left eval  Hip flexion 4- 4  Hip extension 4 4-  Hip abduction 4+ 4  Hip adduction    Hip internal rotation 4 4  Hip external rotation 4 4  Knee flexion 4 4  Knee extension 5 5  Ankle dorsiflexion    Ankle plantarflexion  Ankle inversion    Ankle eversion     (Blank rows = not tested)  SPECIAL TESTS:  (-) Lachman's, posterior drawer, valgus and varus stress tests B knees   FUNCTIONAL TESTS:    GAIT: Distance walked: 60 ft Assistive device utilized: None Level of assistance: Complete Independence Comments: B hip adduction and IR during stance phase  B genu valgus  during ascending and descending stairs, Pt states knees feel unstable   TREATMENT DATE: 09/08/2024                                                                                                                                Neuromuscular re education  Performed with the intent on improving B femoral control and medial patellar tracking to decrease stress to B patella R > L when performing closed chain movements such as stair negotiation.  Standing TFL stretch  R 30 seconds x 3 L 30 seconds x 3  Green band around knees  Cowboy walk    Forward 30 ft x 2 with towel padding  Green band around ankles   Side step 30 ft to the R and 30 ft to the L   Standing split squat with contralateral UE assist   R 10x3  L 10x3   Lateral to forward step up onto 4 inch step with emphasis on femoral control and pelvic hip dissociation  R 10x3 L 10x3   Standing mini squat with ball between knees hip adduction isometrics   5x, then 10x5 seconds for 3 sets  Supine bridge with hip adduction small blue ball squeeze 10x5 seconds for 3 sets   Prone hip extension, knee bent  R 10x5 seconds  L 10x5 seconds    Improved exercise technique, movement at target joints, use of target muscles after mod verbal, visual, tactile cues.     PATIENT EDUCATION:  Education details: there-ex, HEP, POC Person educated: Patient Education method: Explanation, Demonstration, Tactile cues, Verbal cues, and Handouts Education comprehension: verbalized understanding and returned demonstration  HOME EXERCISE PROGRAM: Access Code: [email or web address] URL: [email or web address] Date: 09/05/2024 Prepared by: Emil Glassman  Exercises - Seated Hip Adduction Isometrics with Ball  - 1 x daily - 7 x weekly - 3 sets - 10 reps - 5 seconds hold - Seated Hip External Rotation with Resistance  - 1 x daily - 7 x weekly - 2 sets - 10 reps - 5 seconds hold - Supine Bridge with Mini Swiss Ball Between Knees  - 1 x daily - 7  x weekly - 3 sets - 10 reps - 5 seconds hold - Sidelying Hip Abduction  - 1 x daily - 7 x weekly - 3 sets - 10 reps  - Standing ITB Stretch  - 3 x daily - 7 x weekly - 1 sets - 5 reps - 30 seconds hold   ASSESSMENT:  CLINICAL IMPRESSION: Continued working on glute med, max, and vastus medialis muscle strengthening, to  promote medial patellar glide R and L to promote better patellar mechanics and therefore decrease knee pain. Pt tolerated session well without aggravation of symptoms.  Pt will benefit from continued skilled physical therapy services to decrease pain, improve strength and function.     OBJECTIVE IMPAIRMENTS: improper body mechanics, postural dysfunction, and pain.   ACTIVITY LIMITATIONS: squatting, stairs, and locomotion level  PARTICIPATION LIMITATIONS:   PERSONAL FACTORS: Fitness are also affecting patient's functional outcome.   REHAB POTENTIAL: Fair    CLINICAL DECISION MAKING: Stable/uncomplicated  EVALUATION COMPLEXITY: Low   GOALS: Goals reviewed with patient? Yes  SHORT TERM GOALS: Target date: 09/09/2024  Pt will be independent with her initial HEP to decrease pain, improve strength, function, ability to negotiate stairs and ambulate longer periods more comfortably for her knees.  Baseline: Pt has started her initial HEP (09/01/2024) Goal status: INITIAL   LONG TERM GOALS: Target date: 10/14/2024  Pt will have a decrease in R knee pain to 3/10 or less at worst to promote ability to tolerate kneeling at her [place] ceremonies, negotiate stairs, ambulate for longer periods more comfortably.  Baseline: R knee pain: 9/10 at most for the past 3 weeks (09/01/2024) Goal status: INITIAL  2.  Pt will have a decrease in R posterior hip pain to 0/10 or less at worst to promote ability to perform functional tasks more comfortably.  Baseline: R posterior hip pain: 7/10 at most for the past 3 months (09/01/2024) Goal status: INITIAL  3.  Pt will improve B hip  extension, abduction, and ER strength by at least 1/2 MMT grade to promote ability to perform closed chain tasks more comfortably for her knees and R posterior hip.  Baseline:  MMT Right eval Left eval  Hip extension 4 4-  Hip abduction 4+ 4  Hip external rotation 4 4   Goal status: INITIAL  4.  Pt will improve her LEFS score by at least 10 points as a demonstration of improved function.  Baseline: 66/80 (09/01/2024) Goal status: INITIAL   PLAN:  PT FREQUENCY: 1-2x/week  PT DURATION: 6 weeks  PLANNED INTERVENTIONS: 97110-Therapeutic exercises, 97530- Therapeutic activity, 97112- Neuromuscular re-education, 97535- Self Care, 02859- Manual therapy, G0283- Electrical stimulation (unattended), [phone]- Ionotophoresis 4mg /ml Dexamethasone, Patient/Family education, Stair training, Joint mobilization, and Spinal mobilization.  PLAN FOR NEXT SESSION: glute med and max strengthening, femoral control, mechanics, manual techniques, modalities PRN   Daviana Haymaker, PT, DPT 09/08/2024, 4:34 PM

## 2024-09-12 ENCOUNTER — Ambulatory Visit (INDEPENDENT_AMBULATORY_CARE_PROVIDER_SITE_OTHER)

## 2024-09-12 DIAGNOSIS — Z23 Encounter for immunization: Secondary | ICD-10-CM

## 2024-09-12 NOTE — Progress Notes (Signed)
 Patient is in office today for a nurse visit for 2nd Hepatitis B  Immunization. Patient Injection was given in the  Right deltoid. Patient tolerated injection well.

## 2024-09-13 ENCOUNTER — Other Ambulatory Visit: Payer: Self-pay

## 2024-09-13 ENCOUNTER — Encounter: Payer: Self-pay | Admitting: Internal Medicine

## 2024-09-13 ENCOUNTER — Ambulatory Visit

## 2024-09-13 ENCOUNTER — Ambulatory Visit: Admitting: Anesthesiology

## 2024-09-13 ENCOUNTER — Ambulatory Visit
Admission: RE | Admit: 2024-09-13 | Discharge: 2024-09-13 | Disposition: A | Discharge status: Home / Self Care | Attending: Internal Medicine | Admitting: Internal Medicine

## 2024-09-13 ENCOUNTER — Encounter
Admission: RE | Disposition: A | Discharge status: Home / Self Care | Payer: Self-pay | Source: Home / Self Care | Attending: Internal Medicine

## 2024-09-13 DIAGNOSIS — K5730 Diverticulosis of large intestine without perforation or abscess without bleeding: Secondary | ICD-10-CM | POA: Insufficient documentation

## 2024-09-13 DIAGNOSIS — K640 First degree hemorrhoids: Secondary | ICD-10-CM | POA: Diagnosis not present

## 2024-09-13 DIAGNOSIS — K649 Unspecified hemorrhoids: Secondary | ICD-10-CM | POA: Diagnosis not present

## 2024-09-13 DIAGNOSIS — Z1211 Encounter for screening for malignant neoplasm of colon: Secondary | ICD-10-CM | POA: Insufficient documentation

## 2024-09-13 HISTORY — PX: COLONOSCOPY: SHX5424

## 2024-09-13 LAB — POCT PREGNANCY, URINE: Preg Test, Ur: NEGATIVE

## 2024-09-13 SURGERY — COLONOSCOPY
Anesthesia: General

## 2024-09-13 MED ORDER — DEXMEDETOMIDINE HCL IN NACL 80 MCG/20ML IV SOLN
INTRAVENOUS | Status: DC | PRN
  Administered 2024-09-13: 8 ug via INTRAVENOUS
  Administered 2024-09-13: 12 ug via INTRAVENOUS

## 2024-09-13 MED ORDER — LIDOCAINE HCL (CARDIAC) PF 100 MG/5ML IV SOSY
PREFILLED_SYRINGE | INTRAVENOUS | Status: DC | PRN
  Administered 2024-09-13: 60 mg via INTRAVENOUS

## 2024-09-13 MED ORDER — PROPOFOL 10 MG/ML IV BOLUS
INTRAVENOUS | Status: DC | PRN
  Administered 2024-09-13: 40 mg via INTRAVENOUS
  Administered 2024-09-13: 20 mg via INTRAVENOUS

## 2024-09-13 MED ORDER — PROPOFOL 10 MG/ML IV BOLUS
INTRAVENOUS | Status: AC
  Filled 2024-09-13: qty 20

## 2024-09-13 MED ORDER — PROPOFOL 500 MG/50ML IV EMUL
INTRAVENOUS | Status: DC | PRN
  Administered 2024-09-13: 75 ug/kg/min via INTRAVENOUS

## 2024-09-13 MED ORDER — SODIUM CHLORIDE 0.9 % IV SOLN
INTRAVENOUS | Status: DC
Start: 2024-09-13 — End: 2024-09-13

## 2024-09-13 MED ORDER — ALBUTEROL SULFATE HFA 108 (90 BASE) MCG/ACT IN AERS
INHALATION_SPRAY | RESPIRATORY_TRACT | Status: AC
Start: 2024-09-13 — End: 2024-09-13
  Filled 2024-09-13: qty 6.7

## 2024-09-13 NOTE — H&P (Signed)
 Outpatient short stay form Pre-procedure 09/13/2024 11:50 AM Branae Crail K. Aundria, M.D.  Primary Physician: Rollene Northern, FNP  Reason for visit:  Colon cancer screening  History of present illness:  Patient presents for colonoscopy for colon cancer screening. The patient denies complaints of abdominal pain, significant change in bowel habits, or rectal bleeding.      Current Facility-Administered Medications:    0.9 %  sodium chloride infusion, , Intravenous, Continuous, [place], Ladell POUR, MD, Last Rate: 20 mL/hr at 09/13/24 1116, New Bag at 09/13/24 1116  Medications Prior to Admission  Medication Sig Dispense Refill Last Dose/Taking   ascorbic acid (VITAMIN C) 1000 MG tablet Take 1 tablet by mouth daily.   Past Week   cholecalciferol (VITAMIN D3) 25 MCG (1000 UNIT) tablet Take 1,000 Units by mouth daily.   Past Week   folic acid (FOLVITE) 1 MG tablet Take by mouth.   Past Week   norgestimate -ethinyl estradiol  (ORTHO-CYCLEN) 0.25-35 MG-MCG tablet Take 1 tablet by mouth daily. 90 tablet 3      No Known Allergies   Past Medical History:  Diagnosis Date   Heart murmur    UTI (urinary tract infection)     Review of systems:  Otherwise negative.    Physical Exam  Gen: Alert, oriented. Appears stated age.  HEENT: [place]/AT. PERRLA. Lungs: CTA, no wheezes. CV: RR nl S1, S2. Abd: soft, benign, no masses. BS+ Ext: No edema. Pulses 2+    Planned procedures: Proceed with colonoscopy. The patient understands the nature of the planned procedure, indications, risks, alternatives and potential complications including but not limited to bleeding, infection, perforation, damage to internal organs and possible oversedation/side effects from anesthesia. The patient agrees and gives consent to proceed.  Please refer to procedure notes for findings, recommendations and patient disposition/instructions.     Kristain Hu K. Aundria, M.D. Gastroenterology 09/13/2024  11:50 AM

## 2024-09-13 NOTE — Anesthesia Preprocedure Evaluation (Signed)
 Anesthesia Evaluation  Patient identified by MRN, date of birth, ID band Patient awake    Reviewed: Allergy & Precautions, H&P , NPO status , Patient's Chart, lab work & pertinent test results, reviewed documented beta blocker date and time   Airway Mallampati: II   Neck ROM: full    Dental  (+) Poor Dentition   Pulmonary neg pulmonary ROS   Pulmonary exam normal        Cardiovascular Exercise Tolerance: Good Normal cardiovascular exam+ Valvular Problems/Murmurs  Rhythm:regular Rate:Normal     Neuro/Psych negative neurological ROS  negative psych ROS   GI/Hepatic negative GI ROS, Neg liver ROS,,,  Endo/Other  negative endocrine ROS    Renal/GU negative Renal ROS  negative genitourinary   Musculoskeletal   Abdominal   Peds  Hematology negative hematology ROS (+)   Anesthesia Other Findings Past Medical History: No date: Heart murmur No date: UTI (urinary tract infection) Past Surgical History: No date: UTERINE FIBROID SURGERY BMI    Body Mass Index: 24.37 kg/m     Reproductive/Obstetrics negative OB ROS                              Anesthesia Physical Anesthesia Plan  ASA: 2  Anesthesia Plan: General   Post-op Pain Management:    Induction:   PONV Risk Score and Plan:   Airway Management Planned:   Additional Equipment:   Intra-op Plan:   Post-operative Plan:   Informed Consent: I have reviewed the patients History and Physical, chart, labs and discussed the procedure including the risks, benefits and alternatives for the proposed anesthesia with the patient or authorized representative who has indicated his/her understanding and acceptance.     Dental Advisory Given and Interpreter used for interview  Plan Discussed with: CRNA  Anesthesia Plan Comments:         Anesthesia Quick Evaluation

## 2024-09-13 NOTE — Interval H&P Note (Signed)
 History and Physical Interval Note:  09/13/2024 11:51 AM  Jordan Schmidt  has presented today for surgery, with the diagnosis of Colon cancer screening [Z12.11].  The various methods of treatment have been discussed with the patient and family. After consideration of risks, benefits and other options for treatment, the patient has consented to  Procedure(s) with comments: COLONOSCOPY (N/A) - JAPANESE INTERPRETER as a surgical intervention.  The patient's history has been reviewed, patient examined, no change in status, stable for surgery.  I have reviewed the patient's chart and labs.  Questions were answered to the patient's satisfaction.     [place], Esa Raden

## 2024-09-13 NOTE — Op Note (Signed)
 [facility] Gastroenterology Patient Name: Jordan Schmidt Procedure Date: 09/13/2024 11:49 AM MRN: [id number] Account #: [id number] Date of Birth: Apr 19, 1976 Admit Type: Outpatient Age: 48 Room: [facility] ENDO ROOM 3 Gender: Female Note Status: Finalized Instrument Name: Colon Scope [id number] Procedure:             Colonoscopy Indications:           Screening for colorectal malignant neoplasm Providers:             Torre Schaumburg K. Aundria MD, MD Referring MD:          Rollene MATSU. Arnett (Referring MD) Medicines:             Propofol per Anesthesia Complications:         No immediate complications. Estimated blood loss: None. Procedure:             Pre-Anesthesia Assessment:                        - The risks and benefits of the procedure and the                         sedation options and risks were discussed with the                         patient. All questions were answered and informed                         consent was obtained.                        - Patient identification and proposed procedure were                         verified prior to the procedure by the nurse. The                         procedure was verified in the procedure room.                        - ASA Grade Assessment: III - A patient with severe                         systemic disease.                        - After reviewing the risks and benefits, the patient                         was deemed in satisfactory condition to undergo the                         procedure.                        After obtaining informed consent, the colonoscope was                         passed under direct vision. Throughout the procedure,  the patient's blood pressure, pulse, and oxygen                         saturations were monitored continuously. The                         Colonoscope was introduced through the anus and                         advanced to the the cecum, identified by  appendiceal                         orifice and ileocecal valve. The colonoscopy was                         performed without difficulty. The patient tolerated                         the procedure well. The quality of the bowel                         preparation was good. The ileocecal valve, appendiceal                         orifice, and rectum were photographed. Findings:      The perianal and digital rectal examinations were normal. Pertinent       negatives include normal sphincter tone and no palpable rectal lesions.      Non-bleeding internal hemorrhoids were found during retroflexion. The       hemorrhoids were Grade I (internal hemorrhoids that do not prolapse).      A single small-mouthed diverticulum was found in the ascending colon.      The exam was otherwise without abnormality. Impression:            - Non-bleeding internal hemorrhoids.                        - Diverticulosis in the ascending colon.                        - The examination was otherwise normal.                        - No specimens collected. Recommendation:        - Patient has a contact number available for                         emergencies. The signs and symptoms of potential                         delayed complications were discussed with the patient.                         Return to normal activities tomorrow. Written                         discharge instructions were provided to the patient.                        -  Resume previous diet.                        - Continue present medications.                        - Repeat colonoscopy in 10 years for screening                         purposes.                        - Return to GI office PRN.                        - The findings and recommendations were discussed with                         the patient. Procedure Code(s):     --- Professional ---                        H9878, Colorectal cancer screening; colonoscopy on                          individual not meeting criteria for high risk Diagnosis Code(s):     --- Professional ---                        K57.30, Diverticulosis of large intestine without                         perforation or abscess without bleeding                        K64.0, First degree hemorrhoids                        Z12.11, Encounter for screening for malignant neoplasm                         of colon CPT copyright 2022 American Medical Association. All rights reserved. The codes documented in this report are preliminary and upon coder review may  be revised to meet current compliance requirements. [name] MD, MD 09/13/2024 12:06:40 PM This report has been signed electronically. Number of Addenda: 0 Note Initiated On: 09/13/2024 11:49 AM Scope Withdrawal Time: 0 hours 5 minutes 41 seconds  Total Procedure Duration: 0 hours 8 minutes 56 seconds  Estimated Blood Loss:  Estimated blood loss: none.      [facility]

## 2024-09-13 NOTE — Anesthesia Postprocedure Evaluation (Signed)
 Anesthesia Post Note  Patient: Jordan Schmidt  Procedure(s) Performed: COLONOSCOPY  Patient location during evaluation: PACU Anesthesia Type: General Level of consciousness: awake and alert Pain management: pain level controlled Vital Signs Assessment: post-procedure vital signs reviewed and stable Respiratory status: spontaneous breathing, nonlabored ventilation, respiratory function stable and patient connected to nasal cannula oxygen Cardiovascular status: blood pressure returned to baseline and stable Postop Assessment: no apparent nausea or vomiting Anesthetic complications: no   No notable events documented.   Last Vitals:  Vitals:   09/13/24 1227 09/13/24 1237  BP: 100/76 100/75  Pulse: 64 64  Resp: 19 13  Temp:    SpO2: 100% 100%    Last Pain:  Vitals:   09/13/24 1237  TempSrc:   PainSc: 0-No pain                 Lynwood KANDICE Clause

## 2024-09-13 NOTE — Transfer of Care (Signed)
 Immediate Anesthesia Transfer of Care Note  Patient: Jordan Schmidt  Procedure(s) Performed: COLONOSCOPY  Patient Location: PACU  Anesthesia Type:General  Level of Consciousness: sedated  Airway & Oxygen Therapy: Patient Spontanous Breathing  Post-op Assessment: Report given to RN and Post -op Vital signs reviewed and stable  Post vital signs: Reviewed and stable  Last Vitals:  Vitals Value Taken Time  BP 78/48 09/13/24 12:09  Temp 35.7 C 09/13/24 12:07  Pulse 65 09/13/24 12:08  Resp 15 09/13/24 12:09  SpO2 98 % 09/13/24 12:08  Vitals shown include unfiled device data.  Last Pain:  Vitals:   09/13/24 1207  TempSrc: Temporal  PainSc: 0-No pain         Complications: No notable events documented.

## 2024-09-16 ENCOUNTER — Ambulatory Visit: Attending: Nurse Practitioner

## 2024-09-16 DIAGNOSIS — M25561 Pain in right knee: Secondary | ICD-10-CM | POA: Insufficient documentation

## 2024-09-16 DIAGNOSIS — M25562 Pain in left knee: Secondary | ICD-10-CM | POA: Diagnosis not present

## 2024-09-16 DIAGNOSIS — M25551 Pain in right hip: Secondary | ICD-10-CM | POA: Insufficient documentation

## 2024-09-16 NOTE — Therapy (Signed)
 OUTPATIENT PHYSICAL THERAPY THORACOLUMBAR EVALUATION   Patient Name: Jordan Schmidt MRN: [id number] DOB:01/08/1976, 48 y.o., female Today's Date: 09/16/2024  END OF SESSION:  PT End of Session - 09/16/24 0950     Visit Number 4    Number of Visits 13    Date for Recertification  10/14/24    PT Start Time 0950    PT Stop Time 1036    PT Time Calculation (min) 46 min    Activity Tolerance Patient tolerated treatment well    Behavior During Therapy [facility] for tasks assessed/performed             Past Medical History:  Diagnosis Date   Heart murmur    UTI (urinary tract infection)    Past Surgical History:  Procedure Laterality Date   COLONOSCOPY N/A 09/13/2024   Procedure: COLONOSCOPY;  Surgeon: Toledo, Ladell POUR, MD;  Location: [facility] ENDOSCOPY;  Service: Gastroenterology;  Laterality: N/A;  JAPANESE INTERPRETER   UTERINE FIBROID SURGERY     Patient Active Problem List   Diagnosis Date Noted   Screening for cervical cancer 09/03/2021   Palpitations 06/26/2021   Infertility management 07/02/2017   Routine physical examination 07/02/2017   History of palpitations 07/02/2017    PCP: Dineen Rollene MATSU, FNP   REFERRING PROVIDER: Justino Factor, NP  REFERRING DIAG: R hip/buttocks pain  Rationale for Evaluation and Treatment: Rehabilitation  THERAPY DIAG:  Acute pain of right knee  Acute pain of left knee  Pain in right hip  ONSET DATE: 07/2024  SUBJECTIVE:                                                                                                                                                                                           SUBJECTIVE STATEMENT: Went to [place] last night. R lateral knee is a little swollen 3/10 R anterior lateral knee pain currently, tender. 3/10 yesterday at the tea ceremony. 3/10 R kne pain at most for the past 7 days       PERTINENT HISTORY:  R posterior hip/low back pain (R ischial tuberosity area).  Pain  began about a year ago. Pain disappeared in August 2025. Did exercises that her doctor gave her which worked. Exercises include standing R hip flexor, R quadriceps stretch, R piriformis stretch.   Currently pain is both her knees. Pt did paddle boarding on her knees about 4 weeks ago (08/03/2024) which bothered her knees. Pain has improved since then and the swelling as well.  Pt does [place] tea ceremony which involves pt having to kneel which bothers her knees R > L. Pain is improving. She kneels  for 2 hours during the [place] tea ceremony.   Going back to [place] on October 4 to 23, 2025.   R hip is usually very painful on  long flight    No blood pressure problems per pt.  No latex allergies.   Possible adhesive tape allergies per pt.   PAIN:  Are you having pain? Yes: NPRS scale: 2/10 currently Pain location: R interior lateral patella Pain description: unstable Aggravating factors: kneeling, end range knee flexion in SKTC position; walking for an hour Relieving factors: non-weight bearing, neutral knee  PRECAUTIONS: None  RED FLAGS: Bowel or bladder incontinence: No and Cauda equina syndrome: No   WEIGHT BEARING RESTRICTIONS: No  FALLS:  Has patient fallen in last 6 months? No  LIVING ENVIRONMENT: Lives with:  Lives in:  Stairs:  Has following equipment at home:   OCCUPATION: no  PLOF: Independent  PATIENT GOALS: be able to sit on her heels and kneel for a long time during the [place] tea ceremony which is about 2 hours  NEXT MD VISIT: none  OBJECTIVE:  Note: Objective measures were completed at Evaluation unless otherwise noted.  DIAGNOSTIC FINDINGS:    PATIENT SURVEYS:  LEFS  Extreme difficulty/unable (0), Quite a bit of difficulty (1), Moderate difficulty (2), Little difficulty (3), No difficulty (4) Survey date:  09/01/2024  Any of your usual work, housework or school activities 4  2. Usual hobbies, recreational or sporting activities 2  3. Getting  into/out of the bath 3  4. Walking between rooms 4  5. Putting on socks/shoes 4  6. Squatting  4  7. Lifting an object, like a bag of groceries from the floor 4  8. Performing light activities around your home 4  9. Performing heavy activities around your home 4  10. Getting into/out of a car 4  11. Walking 2 blocks 4  12. Walking 1 mile 4  13. Going up/down 10 stairs (1 flight) 4  14. Standing for 1 hour 4  15.  sitting for 1 hour 4  16. Running on even ground 3  17. Running on uneven ground 3  18. Making sharp turns while running fast 4  19. Hopping  3  20. Rolling over in bed 4  Score total:  66/80     COGNITION: Overall cognitive status: Within functional limits for tasks assessed     SENSATION:   MUSCLE LENGTH:  POSTURE: very slight R foot pronation  PALPATION: TTP R inferior lateral patella No TTP L knee  Good medial patellar glide in supine, leg straight R and L. Slight decreased medial R knee in about 90 degrees flexion position   TTP R piriformis muscle   LUMBAR ROM:   AROM eval  Flexion full  Extension full  Right lateral flexion full  Left lateral flexion full  Right rotation full  Left rotation full   (Blank rows = not tested)  LOWER EXTREMITY ROM:     Active  Right eval Left eval  Hip flexion    Hip extension    Hip abduction    Hip adduction    Hip internal rotation    Hip external rotation    Knee flexion    Knee extension    Ankle dorsiflexion    Ankle plantarflexion    Ankle inversion    Ankle eversion     (Blank rows = not tested)  LOWER EXTREMITY MMT:    MMT Right eval Left eval  Hip flexion 4- 4  Hip extension 4 4-  Hip abduction 4+ 4  Hip adduction    Hip internal rotation 4 4  Hip external rotation 4 4  Knee flexion 4 4  Knee extension 5 5  Ankle dorsiflexion    Ankle plantarflexion    Ankle inversion    Ankle eversion     (Blank rows = not tested)  SPECIAL TESTS:  (-) Lachman's, posterior drawer, valgus  and varus stress tests B knees   FUNCTIONAL TESTS:    GAIT: Distance walked: 60 ft Assistive device utilized: None Level of assistance: Complete Independence Comments: B hip adduction and IR during stance phase  B genu valgus during ascending and descending stairs, Pt states knees feel unstable   TREATMENT DATE: 09/16/2024                                                                                                                                Neuromuscular re education  Performed with the intent on improving B femoral control and medial patellar tracking to decrease stress to B patella R > L when performing closed chain movements such as stair negotiation.  Getting up from the kneeling position: B genu valgus observed  Standing deep squats with green band around knees (towel padding) 10x, emphasis on femoral control   Onto Air Ex Pad with green band around knees (towel padding) Stand <> kneeling emphasis on femoral control 10x  R posterior hip and R anterior lateral knee pain  Supine manual R ankle EV stretch 10x5 seconds for 2 sets  Supine manual R calcaneal EV ROM 10x5 seconds for 2 sets  [employer] Ex Pad with green band around knees (towel padding) Stand <> kneeling emphasis on femoral control 10x  No R knee and posterior hip pain after ankle ROM  Long sitting R ankle EV green band 10x3     Improved exercise technique, movement at target joints, use of target muscles after mod verbal, visual, tactile cues.     PATIENT EDUCATION:  Education details: there-ex, HEP, POC Person educated: Patient Education method: Explanation, Demonstration, Tactile cues, Verbal cues, and Handouts Education comprehension: verbalized understanding and returned demonstration  HOME EXERCISE PROGRAM: Access Code: [email or web address] URL: [email or web address] Date: 09/05/2024 Prepared by: Emil Glassman  Exercises - Seated Hip Adduction Isometrics with Ball  - 1 x daily - 7  x weekly - 3 sets - 10 reps - 5 seconds hold - Seated Hip External Rotation with Resistance  - 1 x daily - 7 x weekly - 2 sets - 10 reps - 5 seconds hold - Supine Bridge with Mini Swiss Ball Between Knees  - 1 x daily - 7 x weekly - 3 sets - 10 reps - 5 seconds hold - Sidelying Hip Abduction  - 1 x daily - 7 x weekly - 3 sets - 10 reps  - Standing ITB Stretch  - 3 x daily - 7 x weekly - 1 sets -  5 reps - 30 seconds hold  - Long Sitting Ankle Eversion with Resistance  - 1 x daily - 7 x weekly - 3 sets - 10 reps .  ASSESSMENT:  CLINICAL IMPRESSION: R anterior lateral knee pain and R posterior hip pain when getting up from the kneeling position. Worked on femoral control with standing up from the kneeling position but demonstrated R knee pain which improved with improving R ankle EV ROM. Improving R ankle EV ROM (especially calcaneus) improved pt ability to decrease R genu valgus when standing up and therefore decreased R anterior lateral knee joint and patellar pain as well as decreased R posterior hip pain. Pt tolerated session well without aggravation of symptoms.  Pt will benefit from continued skilled physical therapy services to decrease pain, improve strength and function.     OBJECTIVE IMPAIRMENTS: improper body mechanics, postural dysfunction, and pain.   ACTIVITY LIMITATIONS: squatting, stairs, and locomotion level  PARTICIPATION LIMITATIONS:   PERSONAL FACTORS: Fitness are also affecting patient's functional outcome.   REHAB POTENTIAL: Fair    CLINICAL DECISION MAKING: Stable/uncomplicated  EVALUATION COMPLEXITY: Low   GOALS: Goals reviewed with patient? Yes  SHORT TERM GOALS: Target date: 09/09/2024  Pt will be independent with her initial HEP to decrease pain, improve strength, function, ability to negotiate stairs and ambulate longer periods more comfortably for her knees.  Baseline: Pt has started her initial HEP (09/01/2024) Goal status: INITIAL   LONG TERM GOALS:  Target date: 10/14/2024  Pt will have a decrease in R knee pain to 3/10 or less at worst to promote ability to tolerate kneeling at her [place], negotiate stairs, ambulate for longer periods more comfortably.  Baseline: R knee pain: 9/10 at most for the past 3 weeks (09/01/2024) Goal status: INITIAL  2.  Pt will have a decrease in R posterior hip pain to 0/10 or less at worst to promote ability to perform functional tasks more comfortably.  Baseline: R posterior hip pain: 7/10 at most for the past 3 months (09/01/2024) Goal status: INITIAL  3.  Pt will improve B hip extension, abduction, and ER strength by at least 1/2 MMT grade to promote ability to perform closed chain tasks more comfortably for her knees and R posterior hip.  Baseline:  MMT Right eval Left eval  Hip extension 4 4-  Hip abduction 4+ 4  Hip external rotation 4 4   Goal status: INITIAL  4.  Pt will improve her LEFS score by at least 10 points as a demonstration of improved function.  Baseline: 66/80 (09/01/2024) Goal status: INITIAL   PLAN:  PT FREQUENCY: 1-2x/week  PT DURATION: 6 weeks  PLANNED INTERVENTIONS: 97110-Therapeutic exercises, 97530- Therapeutic activity, 97112- Neuromuscular re-education, 97535- Self Care, 02859- Manual therapy, G0283- Electrical stimulation (unattended), [phone]- Ionotophoresis 4mg /ml Dexamethasone, Patient/Family education, Stair training, Joint mobilization, and Spinal mobilization.  PLAN FOR NEXT SESSION: glute med and max strengthening, femoral control, mechanics, manual techniques, modalities PRN   Kameria Canizares, PT, DPT 09/16/2024, 10:44 AM

## 2024-09-20 ENCOUNTER — Encounter

## 2024-09-22 ENCOUNTER — Encounter

## 2024-09-27 ENCOUNTER — Encounter

## 2024-09-29 ENCOUNTER — Encounter

## 2024-10-04 ENCOUNTER — Encounter

## 2024-10-06 ENCOUNTER — Encounter

## 2024-10-11 ENCOUNTER — Ambulatory Visit

## 2024-10-11 DIAGNOSIS — M25551 Pain in right hip: Secondary | ICD-10-CM

## 2024-10-11 DIAGNOSIS — M25562 Pain in left knee: Secondary | ICD-10-CM | POA: Diagnosis not present

## 2024-10-11 DIAGNOSIS — M25561 Pain in right knee: Secondary | ICD-10-CM

## 2024-10-11 NOTE — Therapy (Signed)
 OUTPATIENT PHYSICAL THERAPY THORACOLUMBAR EVALUATION   Patient Name: Jordan Schmidt MRN: [id number] DOB:[birth year], 48 y.o., female Today's Date: 10/11/2024  END OF SESSION:  PT End of Session - 10/11/24 1520     Visit Number 5    Number of Visits 13    Date for Recertification  10/14/24    PT Start Time 1520    PT Stop Time 1601    PT Time Calculation (min) 41 min    Activity Tolerance Patient tolerated treatment well    Behavior During Therapy [facility] for tasks assessed/performed              Past Medical History:  Diagnosis Date   Heart murmur    UTI (urinary tract infection)    Past Surgical History:  Procedure Laterality Date   COLONOSCOPY N/A 09/13/2024   Procedure: COLONOSCOPY;  Surgeon: Toledo, Ladell POUR, MD;  Location: [facility] ENDOSCOPY;  Service: Gastroenterology;  Laterality: N/A;  JAP[place]ESE INTERPRETER   UTERINE FIBROID SURGERY     Patient Active Problem List   Diagnosis Date Noted   Screening for cervical cancer 09/03/2021   Palpitations 06/26/2021   Infertility management 07/02/2017   Routine physical examination 07/02/2017   History of palpitations 07/02/2017    PCP: Dineen Rollene MATSU, FNP   REFERRING PROVIDER: Justino Factor, NP  REFERRING DIAG: R hip/buttocks pain  Rationale for Evaluation and Treatment: Rehabilitation  THERAPY DIAG:  Acute pain of right knee  Acute pain of left knee  Pain in right hip  ONSET DATE: 07/2024  SUBJECTIVE:                                                                                                                                                                                           SUBJECTIVE STATEMENT: R knee is good, not perfect but much better than before. Did not perform any tea ceremonies in [place] or after coming back from her trip yet.  No R knee pain currently.         PERTINENT HISTORY:  R posterior hip/low back pain (R ischial tuberosity area).  Pain began about a year ago. Pain  disappeared in August 2025. Did exercises that her doctor gave her which worked. Exercises include standing R hip flexor, R quadriceps stretch, R piriformis stretch.   Currently pain is both her knees. Pt did paddle boarding on her knees about 4 weeks ago (08/03/2024) which bothered her knees. Pain has improved since then and the swelling as well.  Pt does Japanese tea ceremony which involves pt having to kneel which bothers her knees R > L. Pain is improving. She kneels for 2 hours  during the Japanese tea ceremony.   Going back to [place] on October 4 to 23, 2025.   R hip is usually very painful on  long flight    No blood pressure problems per pt.  No latex allergies.   Possible adhesive tape allergies per pt.   PAIN:  Are you having pain? Yes: NPRS scale: 2/10 currently Pain location: R interior lateral patella Pain description: unstable Aggravating factors: kneeling, end range knee flexion in SKTC position; walking for an hour Relieving factors: non-weight bearing, neutral knee  PRECAUTIONS: None  RED FLAGS: Bowel or bladder incontinence: No and Cauda equina syndrome: No   WEIGHT BEARING RESTRICTIONS: No  FALLS:  Has patient fallen in last 6 months? No  LIVING ENVIRONMENT: Lives with:  Lives in:  Stairs:  Has following equipment at home:   OCCUPATION: no  PLOF: Independent  PATIENT GOALS: be able to sit on her heels and kneel for a long time during the Japanese tea ceremony which is about 2 hours  NEXT MD VISIT: none  OBJECTIVE:  Note: Objective measures were completed at Evaluation unless otherwise noted.  DIAGNOSTIC FINDINGS:    PATIENT SURVEYS:  LEFS  Extreme difficulty/unable (0), Quite a bit of difficulty (1), Moderate difficulty (2), Little difficulty (3), No difficulty (4) Survey date:  09/01/2024  Any of your usual work, housework or school activities 4  2. Usual hobbies, recreational or sporting activities 2  3. Getting into/out of the bath 3  4.  Walking between rooms 4  5. Putting on socks/shoes 4  6. Squatting  4  7. Lifting an object, like a bag of groceries from the floor 4  8. Performing light activities around your home 4  9. Performing heavy activities around your home 4  10. Getting into/out of a car 4  11. Walking 2 blocks 4  12. Walking 1 mile 4  13. Going up/down 10 stairs (1 flight) 4  14. Standing for 1 hour 4  15.  sitting for 1 hour 4  16. Running on even ground 3  17. Running on uneven ground 3  18. Making sharp turns while running fast 4  19. Hopping  3  20. Rolling over in bed 4  Score total:  66/80     COGNITION: Overall cognitive status: Within functional limits for tasks assessed     SENSATION:   MUSCLE LENGTH:  POSTURE: very slight R foot pronation  PALPATION: TTP R inferior lateral patella No TTP L knee  Good medial patellar glide in supine, leg straight R and L. Slight decreased medial R knee in about 90 degrees flexion position   TTP R piriformis muscle   LUMBAR ROM:   AROM eval  Flexion full  Extension full  Right lateral flexion full  Left lateral flexion full  Right rotation full  Left rotation full   (Blank rows = not tested)  LOWER EXTREMITY ROM:     Active  Right eval Left eval  Hip flexion    Hip extension    Hip abduction    Hip adduction    Hip internal rotation    Hip external rotation    Knee flexion    Knee extension    Ankle dorsiflexion    Ankle plantarflexion    Ankle inversion    Ankle eversion     (Blank rows = not tested)  LOWER EXTREMITY MMT:    MMT Right eval Left eval  Hip flexion 4- 4  Hip extension 4  4-  Hip abduction 4+ 4  Hip adduction    Hip internal rotation 4 4  Hip external rotation 4 4  Knee flexion 4 4  Knee extension 5 5  Ankle dorsiflexion    Ankle plantarflexion    Ankle inversion    Ankle eversion     (Blank rows = not tested)  SPECIAL TESTS:  (-) Lachman's, posterior drawer, valgus and varus stress tests B  knees   FUNCTIONAL TESTS:    GAIT: Distance walked: 60 ft Assistive device utilized: None Level of assistance: Complete Independence Comments: B hip adduction and IR during stance phase  B genu valgus during ascending and descending stairs, Pt states knees feel unstable   TREATMENT DATE: 10/11/2024                                                                                                                                Neuromuscular re education  Performed with the intent on improving B femoral control and medial patellar tracking to decrease stress to B patella R > L when performing closed chain movements such as stair negotiation.  Onto Air Ex [employer] <> kneeling emphasis on femoral control 10x  Supine manual R ankle EV stretch 10x5 seconds for 2 sets  Supine manual R calcaneal EV ROM 10x5 seconds for 2 sets  Supine R ankle EV green band 10x5 seconds   Onto Air Ex Pad Stand <> kneeling emphasis on femoral control 10x  No R lateral knee pain.    Seated hip ER  R 10x5 seconds green band   Standing split squat with one UE assist, cues for femoral control  R 10x3  L 10x3  Side stepping green band around knees   25 ft to the R and 25 ft to the L    Improved exercise technique, movement at target joints, use of target muscles after mod verbal, visual, tactile cues.     PATIENT EDUCATION:  Education details: there-ex, HEP, POC Person educated: Patient Education method: Explanation, Demonstration, Tactile cues, Verbal cues, and Handouts Education comprehension: verbalized understanding and returned demonstration  HOME EXERCISE PROGRAM: Access Code: [email or web address] URL: [email or web address] Date: 09/05/2024 Prepared by: Emil Glassman  Exercises - Seated Hip Adduction Isometrics with Ball  - 1 x daily - 7 x weekly - 3 sets - 10 reps - 5 seconds hold - Seated Hip External Rotation with Resistance  - 1 x daily - 7 x weekly - 2 sets - 10 reps - 5  seconds hold  upgraded to green band (10/11/2024) - Supine Bridge with Mini Swiss Ball Between Knees  - 1 x daily - 7 x weekly - 3 sets - 10 reps - 5 seconds hold - Sidelying Hip Abduction  - 1 x daily - 7 x weekly - 3 sets - 10 reps  - Standing ITB Stretch  - 3 x daily - 7 x weekly - 1 sets - 5 reps -  30 seconds hold  - Long Sitting Ankle Eversion with Resistance  - 1 x daily - 7 x weekly - 3 sets - 10 reps .  ASSESSMENT:  CLINICAL IMPRESSION: Pt demonstrates overall decreased R hip and knee pain since initial evaluation. Continued working on stand <> kneel for her tea ceremonies with emphasis on femoral control as well as improving R ankle and calcaneal EV to improve R ankle, knee, and hip mechanics during the movement. No knee pain reported with the movement after treatment to promote R ankle EV as well as improving glute strength and femoral control.  Pt will benefit from continued skilled physical therapy services to decrease pain, improve strength and function.     OBJECTIVE IMPAIRMENTS: improper body mechanics, postural dysfunction, and pain.   ACTIVITY LIMITATIONS: squatting, stairs, and locomotion level  PARTICIPATION LIMITATIONS:   PERSONAL FACTORS: Fitness are also affecting patient's functional outcome.   REHAB POTENTIAL: Fair    CLINICAL DECISION MAKING: Stable/uncomplicated  EVALUATION COMPLEXITY: Low   GOALS: Goals reviewed with patient? Yes  SHORT TERM GOALS: Target date: 09/09/2024  Pt will be independent with her initial HEP to decrease pain, improve strength, function, ability to negotiate stairs and ambulate longer periods more comfortably for her knees.  Baseline: Pt has started her initial HEP (09/01/2024); No questions with her HEP, was able to do them while in her trip (10/11/2024) Goal status: MET   LONG TERM GOALS: Target date: 10/14/2024  Pt will have a decrease in R knee pain to 3/10 or less at worst to promote ability to tolerate kneeling at her  [place] Tea ceremonies, negotiate stairs, ambulate for longer periods more comfortably.  Baseline: R knee pain: 9/10 at most for the past 3 weeks (09/01/2024); 1-2/10 R knee pain at most for the past 7 days (10/11/2024) Goal status: MET  2.  Pt will have a decrease in R posterior hip pain to 0/10 or less at worst to promote ability to perform functional tasks more comfortably.  Baseline: R posterior hip pain: 7/10 at most for the past 3 months (09/01/2024); 3/10 R posterior hip pain at most for the past 7 days (10/11/2024) Goal status: PROGRESSING  3.  Pt will improve B hip extension, abduction, and ER strength by at least 1/2 MMT grade to promote ability to perform closed chain tasks more comfortably for her knees and R posterior hip.  Baseline:  MMT Right eval Left eval  Hip extension 4 4-  Hip abduction 4+ 4  Hip external rotation 4 4   Goal status: INITIAL  4.  Pt will improve her LEFS score by at least 10 points as a demonstration of improved function.  Baseline: 66/80 (09/01/2024) Goal status: INITIAL   PLAN:  PT FREQUENCY: 1-2x/week  PT DURATION: 6 weeks  PLANNED INTERVENTIONS: 97110-Therapeutic exercises, 97530- Therapeutic activity, 97112- Neuromuscular re-education, 97535- Self Care, 02859- Manual therapy, G0283- Electrical stimulation (unattended), [phone]- Ionotophoresis 4mg /ml Dexamethasone, Patient/Family education, Stair training, Joint mobilization, and Spinal mobilization.  PLAN FOR NEXT SESSION: glute med and max strengthening, femoral control, mechanics, manual techniques, modalities PRN   Sindee Stucker, PT, DPT 10/11/2024, 4:13 PM

## 2024-10-13 ENCOUNTER — Ambulatory Visit

## 2024-10-13 DIAGNOSIS — M25551 Pain in right hip: Secondary | ICD-10-CM

## 2024-10-13 DIAGNOSIS — M25562 Pain in left knee: Secondary | ICD-10-CM | POA: Diagnosis not present

## 2024-10-13 DIAGNOSIS — M25561 Pain in right knee: Secondary | ICD-10-CM

## 2024-10-13 NOTE — Therapy (Addendum)
 OUTPATIENT PHYSICAL THERAPY TREATMENT And Discharge Summary   Patient Name: Jordan Schmidt MRN: [id number] DOB:02-Feb-1976, 48 y.o., female Today's Date: 10/13/2024  END OF SESSION:  PT End of Session - 10/13/24 1519     Visit Number 6    Number of Visits 13    Date for Recertification  10/14/24    PT Start Time 1519    PT Stop Time 1547    PT Time Calculation (min) 28 min    Activity Tolerance Patient tolerated treatment well    Behavior During Therapy [facility] for tasks assessed/performed               Past Medical History:  Diagnosis Date   Heart murmur    UTI (urinary tract infection)    Past Surgical History:  Procedure Laterality Date   COLONOSCOPY N/A 09/13/2024   Procedure: COLONOSCOPY;  Surgeon: Toledo, Ladell POUR, MD;  Location: [facility] ENDOSCOPY;  Service: Gastroenterology;  Laterality: N/A;  JAPANESE INTERPRETER   UTERINE FIBROID SURGERY     Patient Active Problem List   Diagnosis Date Noted   Screening for cervical cancer 09/03/2021   Palpitations 06/26/2021   Infertility management 07/02/2017   Routine physical examination 07/02/2017   History of palpitations 07/02/2017    PCP: Dineen Rollene MATSU, FNP   REFERRING PROVIDER: Justino Factor, NP  REFERRING DIAG: R hip/buttocks pain  Rationale for Evaluation and Treatment: Rehabilitation  THERAPY DIAG:  Acute pain of right knee  Acute pain of left knee  Pain in right hip  ONSET DATE: 07/2024  SUBJECTIVE:                                                                                                                                                                                           SUBJECTIVE STATEMENT: R knee is good. No pain currently. Doing ok since last session.          PERTINENT HISTORY:  R posterior hip/low back pain (R ischial tuberosity area).  Pain began about a year ago. Pain disappeared in August 2025. Did exercises that her doctor gave her which worked. Exercises  include standing R hip flexor, R quadriceps stretch, R piriformis stretch.   Currently pain is both her knees. Pt did paddle boarding on her knees about 4 weeks ago (08/03/2024) which bothered her knees. Pain has improved since then and the swelling as well.  Pt does Japanese tea ceremony which involves pt having to kneel which bothers her knees R > L. Pain is improving. She kneels for 2 hours during the Japanese tea ceremony.   Going back to [place] on October 4 to 23, 2025.  R hip is usually very painful on  long flight    No blood pressure problems per pt.  No latex allergies.   Possible adhesive tape allergies per pt.   PAIN:  Are you having pain? Yes: NPRS scale: 2/10 currently Pain location: R interior lateral patella Pain description: unstable Aggravating factors: kneeling, end range knee flexion in SKTC position; walking for an hour Relieving factors: non-weight bearing, neutral knee  PRECAUTIONS: None  RED FLAGS: Bowel or bladder incontinence: No and Cauda equina syndrome: No   WEIGHT BEARING RESTRICTIONS: No  FALLS:  Has patient fallen in last 6 months? No  LIVING ENVIRONMENT: Lives with:  Lives in:  Stairs:  Has following equipment at home:   OCCUPATION: no  PLOF: Independent  PATIENT GOALS: be able to sit on her heels and kneel for a long time during the Japanese tea ceremony which is about 2 hours  NEXT MD VISIT: none  OBJECTIVE:  Note: Objective measures were completed at Evaluation unless otherwise noted.  DIAGNOSTIC FINDINGS:    PATIENT SURVEYS:  LEFS  Extreme difficulty/unable (0), Quite a bit of difficulty (1), Moderate difficulty (2), Little difficulty (3), No difficulty (4) Survey date:  09/01/2024  Any of your usual work, housework or school activities 4  2. Usual hobbies, recreational or sporting activities 2  3. Getting into/out of the bath 3  4. Walking between rooms 4  5. Putting on socks/shoes 4  6. Squatting  4  7. Lifting an  object, like a bag of groceries from the floor 4  8. Performing light activities around your home 4  9. Performing heavy activities around your home 4  10. Getting into/out of a car 4  11. Walking 2 blocks 4  12. Walking 1 mile 4  13. Going up/down 10 stairs (1 flight) 4  14. Standing for 1 hour 4  15.  sitting for 1 hour 4  16. Running on even ground 3  17. Running on uneven ground 3  18. Making sharp turns while running fast 4  19. Hopping  3  20. Rolling over in bed 4  Score total:  66/80     COGNITION: Overall cognitive status: Within functional limits for tasks assessed     SENSATION:   MUSCLE LENGTH:  POSTURE: very slight R foot pronation  PALPATION: TTP R inferior lateral patella No TTP L knee  Good medial patellar glide in supine, leg straight R and L. Slight decreased medial R knee in about 90 degrees flexion position   TTP R piriformis muscle   LUMBAR ROM:   AROM eval  Flexion full  Extension full  Right lateral flexion full  Left lateral flexion full  Right rotation full  Left rotation full   (Blank rows = not tested)  LOWER EXTREMITY ROM:     Active  Right eval Left eval  Hip flexion    Hip extension    Hip abduction    Hip adduction    Hip internal rotation    Hip external rotation    Knee flexion    Knee extension    Ankle dorsiflexion    Ankle plantarflexion    Ankle inversion    Ankle eversion     (Blank rows = not tested)  LOWER EXTREMITY MMT:    MMT Right eval Left eval  Hip flexion 4- 4  Hip extension 4 4-  Hip abduction 4+ 4  Hip adduction    Hip internal rotation 4 4  Hip  external rotation 4 4  Knee flexion 4 4  Knee extension 5 5  Ankle dorsiflexion    Ankle plantarflexion    Ankle inversion    Ankle eversion     (Blank rows = not tested)  SPECIAL TESTS:  (-) Lachman's, posterior drawer, valgus and varus stress tests B knees   FUNCTIONAL TESTS:    GAIT: Distance walked: 60 ft Assistive device  utilized: None Level of assistance: Complete Independence Comments: B hip adduction and IR during stance phase  B genu valgus during ascending and descending stairs, Pt states knees feel unstable   TREATMENT DATE: 10/13/2024                                                                                                                                Neuromuscular re education  Performed with the intent on improving B femoral control and medial patellar tracking to decrease stress to B patella R > L when performing closed chain movements such as stair negotiation.  Supine manual R ankle EV stretch 10x5 seconds to promote proper knee and hip mechanics.   Supine manual R calcaneal EV ROM 10x5 seconds for 3 sets to promote proper knee and hip mechanics.   Onto Air Ex [employer] <> kneeling emphasis on femoral control 10x  No R knee pain  Standing split squat with one UE assist, cues for femoral control  R 10x3  L 10x3    Improved exercise technique, movement at target joints, use of target muscles after mod verbal, visual, tactile cues.     PATIENT EDUCATION:  Education details: there-ex, HEP, POC Person educated: Patient Education method: Explanation, Demonstration, Tactile cues, Verbal cues, and Handouts Education comprehension: verbalized understanding and returned demonstration  HOME EXERCISE PROGRAM: Access Code: [email or web address] URL: [email or web address] Date: 09/05/2024 Prepared by: Emil Glassman  Exercises - Seated Hip Adduction Isometrics with Ball  - 1 x daily - 7 x weekly - 3 sets - 10 reps - 5 seconds hold - Seated Hip External Rotation with Resistance  - 1 x daily - 7 x weekly - 2 sets - 10 reps - 5 seconds hold  upgraded to green band (10/11/2024) - Supine Bridge with Mini Swiss Ball Between Knees  - 1 x daily - 7 x weekly - 3 sets - 10 reps - 5 seconds hold - Sidelying Hip Abduction  - 1 x daily - 7 x weekly - 3 sets - 10 reps  - Standing ITB  Stretch  - 3 x daily - 7 x weekly - 1 sets - 5 reps - 30 seconds hold  - Long Sitting Ankle Eversion with Resistance  - 1 x daily - 7 x weekly - 3 sets - 10 reps .  ASSESSMENT:  CLINICAL IMPRESSION: Pt demonstrates overall decreased R hip and knee pain, improved B hip strength and function since initial evaluation. Pt has made very good progress with PT towards goals. Skilled physical  therapy services discharged with pt continuing progress with her exercises at home.      OBJECTIVE IMPAIRMENTS: improper body mechanics, postural dysfunction, and pain.   ACTIVITY LIMITATIONS: squatting, stairs, and locomotion level  PARTICIPATION LIMITATIONS:   PERSONAL FACTORS: Fitness are also affecting patient's functional outcome.   REHAB POTENTIAL: Fair    CLINICAL DECISION MAKING: Stable/uncomplicated  EVALUATION COMPLEXITY: Low   GOALS: Goals reviewed with patient? Yes  SHORT TERM GOALS: Target date: 09/09/2024  Pt will be independent with her initial HEP to decrease pain, improve strength, function, ability to negotiate stairs and ambulate longer periods more comfortably for her knees.  Baseline: Pt has started her initial HEP (09/01/2024); No questions with her HEP, was able to do them while in her trip (10/11/2024) Goal status: MET   LONG TERM GOALS: Target date: 10/14/2024  Pt will have a decrease in R knee pain to 3/10 or less at worst to promote ability to tolerate kneeling at her [place] Tea ceremonies, negotiate stairs, ambulate for longer periods more comfortably.  Baseline: R knee pain: 9/10 at most for the past 3 weeks (09/01/2024); 1-2/10 R knee pain at most for the past 7 days (10/11/2024) Goal status: MET  2.  Pt will have a decrease in R posterior hip pain to 0/10 or less at worst to promote ability to perform functional tasks more comfortably.  Baseline: R posterior hip pain: 7/10 at most for the past 3 months (09/01/2024); 3/10 R posterior hip pain at most for the past  7 days (10/11/2024) Goal status: PROGRESSING  3.  Pt will improve B hip extension, abduction, and ER strength by at least 1/2 MMT grade to promote ability to perform closed chain tasks more comfortably for her knees and R posterior hip.  Baseline:  MMT Right eval Left eval R 10/13/2024 L 10/13/2024  Hip extension 4 4- 4 4+  Hip abduction 4+ 4 5 5   Hip external rotation 4 4 4+ 5   Goal status: PARTIALLY MET  4.  Pt will improve her LEFS score by at least 10 points as a demonstration of improved function.  Baseline: 66/80 (09/01/2024); 80/80 (10/13/2024) Goal status: MET   PLAN:  PT FREQUENCY: 1-2x/week  PT DURATION: 6 weeks  PLANNED INTERVENTIONS: 97110-Therapeutic exercises, 97530- Therapeutic activity, 97112- Neuromuscular re-education, 97535- Self Care, 02859- Manual therapy, G0283- Electrical stimulation (unattended), [phone]- Ionotophoresis 4mg /ml Dexamethasone, Patient/Family education, Stair training, Joint mobilization, and Spinal mobilization.  PLAN FOR NEXT SESSION: glute med and max strengthening, femoral control, mechanics, manual techniques, modalities PRN  Thank you for your referral.   Jyll Tomaro, PT, DPT 10/13/2024, 3:58 PM

## 2024-10-19 ENCOUNTER — Ambulatory Visit

## 2024-10-26 ENCOUNTER — Ambulatory Visit

## 2024-11-01 ENCOUNTER — Ambulatory Visit

## 2024-11-04 ENCOUNTER — Ambulatory Visit

## 2025-07-17 ENCOUNTER — Encounter: Admitting: Family
